# Patient Record
Sex: Female | Born: 1950 | Race: White | Hispanic: No | Marital: Married
Health system: Southern US, Community
[De-identification: ages and names within clinical notes are randomized; demographics above are authoritative.]

## PROBLEM LIST (undated history)

## (undated) DIAGNOSIS — F32A Depression, unspecified: Secondary | ICD-10-CM

## (undated) DIAGNOSIS — F329 Major depressive disorder, single episode, unspecified: Secondary | ICD-10-CM

## (undated) DIAGNOSIS — M810 Age-related osteoporosis without current pathological fracture: Secondary | ICD-10-CM

## (undated) DIAGNOSIS — H3320 Serous retinal detachment, unspecified eye: Secondary | ICD-10-CM

## (undated) DIAGNOSIS — M79609 Pain in unspecified limb: Secondary | ICD-10-CM

## (undated) HISTORY — PX: TONSILECTOMY, ADENOIDECTOMY, BILATERAL MYRINGOTOMY AND TUBES: SHX2538

## (undated) HISTORY — DX: Pain in unspecified limb: M79.609

## (undated) HISTORY — DX: Age-related osteoporosis without current pathological fracture: M81.0

## (undated) HISTORY — DX: Serous retinal detachment, unspecified eye: H33.20

## (undated) HISTORY — PX: OTHER SURGICAL HISTORY: SHX169

## (undated) SURGERY — Surgical Case
Anesthesia: *Unknown

---

## 2011-02-25 ENCOUNTER — Other Ambulatory Visit (HOSPITAL_COMMUNITY)
Admission: RE | Admit: 2011-02-25 | Discharge: 2011-02-25 | Disposition: A | Discharge status: Home / Self Care | Payer: 59 | Source: Ambulatory Visit | Attending: Family Medicine | Admitting: Family Medicine

## 2011-02-25 DIAGNOSIS — Z124 Encounter for screening for malignant neoplasm of cervix: Secondary | ICD-10-CM | POA: Insufficient documentation

## 2011-02-25 DIAGNOSIS — Z1159 Encounter for screening for other viral diseases: Secondary | ICD-10-CM | POA: Insufficient documentation

## 2012-06-08 DIAGNOSIS — H3320 Serous retinal detachment, unspecified eye: Secondary | ICD-10-CM

## 2012-06-08 HISTORY — DX: Serous retinal detachment, unspecified eye: H33.20

## 2013-09-29 ENCOUNTER — Encounter: Payer: Self-pay | Admitting: Neurology

## 2013-10-02 ENCOUNTER — Encounter: Payer: Self-pay | Admitting: Neurology

## 2013-10-02 ENCOUNTER — Ambulatory Visit (INDEPENDENT_AMBULATORY_CARE_PROVIDER_SITE_OTHER): Payer: BC Managed Care – PPO | Admitting: Neurology

## 2013-10-02 ENCOUNTER — Encounter (INDEPENDENT_AMBULATORY_CARE_PROVIDER_SITE_OTHER): Payer: Self-pay

## 2013-10-02 VITALS — BP 136/83 | HR 79 | Ht 64.25 in | Wt 155.0 lb

## 2013-10-02 DIAGNOSIS — M79609 Pain in unspecified limb: Secondary | ICD-10-CM

## 2013-10-02 HISTORY — DX: Pain in unspecified limb: M79.609

## 2013-10-02 NOTE — Progress Notes (Signed)
Reason for visit: Foot pain  Beth SprangSusan Jackson is a 63 y.o. female  History of present illness:  This is a 63 year old right-handed white female with a history of problems with discomfort in the feet dating back approximately one year. The patient has some burning sensations on the sole of the foot that is present when she is off of her feet, and this does not keep her from getting to sleep. She indicates that while up on her feet, there is some pain in the ball the feet , right greater than left. This pain is more of an achy pain, not a burning pain. This is better when she is off of her feet. The patient denies any significant low back pain, but she does have some slight discomfort in the groin area on the right. She denies problems controlling the bowels or the bladder. There is no problem with balance. The patient denies any issues with the hands. She is sent to this office for further evaluation. There is no family history of a peripheral neuropathy.  Past Medical History  Diagnosis Date  . Osteoporosis 05/1013  . Retinal detachment 06/2012  . Pain in limb 10/02/2013    Past Surgical History  Procedure Laterality Date  . None    . Tonsilectomy, adenoidectomy, bilateral myringotomy and tubes      Family History  Problem Relation Age of Onset  . Heart disease Mother   . Heart disease Father   . Heart attack Father   . Cancer - Other Maternal Grandmother     melanoma  . Heart disease Brother   . Cancer - Other Sister   . Neuropathy Neg Hx     Social history:  reports that she has quit smoking. She has never used smokeless tobacco. She reports that she drinks alcohol. She reports that she does not use illicit drugs.  Medications:  Current Outpatient Prescriptions on File Prior to Visit  Medication Sig Dispense Refill  . aspirin 81 MG tablet Take 81 mg by mouth daily.      . Multiple Vitamin (MULTIVITAMIN) tablet Take 1 tablet by mouth daily.      . Multiple Vitamins-Minerals  (VISION FORMULA PO) Take 1 capsule by mouth daily.       No current facility-administered medications on file prior to visit.      Allergies  Allergen Reactions  . Boniva [Ibandronic Acid]   . Codeine   . Zithromax [Azithromycin]     ROS:  Out of a complete 14 system review of symptoms, the patient complains only of the following symptoms, and all other reviewed systems are negative.  Weight gain Foot discomfort  Blood pressure 136/83, pulse 79, height 5' 4.25" (1.632 m), weight 155 lb (70.308 kg).  Physical Exam  General: The patient is alert and cooperative at the time of the examination.  Eyes: Pupils are equal, round, and reactive to light. Discs are flat bilaterally.  Neck: The neck is supple, no carotid bruits are noted.  Respiratory: The respiratory examination is clear.  Cardiovascular: The cardiovascular examination reveals a regular rate and rhythm, no obvious murmurs or rubs are noted.  Neuromuscular: The patient has good range of movement of the lumbosacral spine.  Skin: Extremities are without significant edema.  Neurologic Exam  Mental status: The patient is alert and oriented x 3 at the time of the examination. The patient has apparent normal recent and remote memory, with an apparently normal attention span and concentration ability.  Cranial  nerves: Facial symmetry is present. There is good sensation of the face to pinprick and soft touch bilaterally. The strength of the facial muscles and the muscles to head turning and shoulder shrug are normal bilaterally. Speech is well enunciated, no aphasia or dysarthria is noted. Extraocular movements are full. Visual fields are full. The tongue is midline, and the patient has symmetric elevation of the soft palate. No obvious hearing deficits are noted.  Motor: The motor testing reveals 5 over 5 strength of all 4 extremities. Good symmetric motor tone is noted throughout. The patient is able to walk on heels and  the toes.  Sensory: Sensory testing is intact to pinprick, soft touch, vibration sensation, and position sense on all 4 extremities. No evidence of extinction is noted.  Coordination: Cerebellar testing reveals good finger-nose-finger and heel-to-shin bilaterally.  Gait and station: Gait is normal. Tandem gait is normal. Romberg is negative. No drift is seen.  Reflexes: Deep tendon reflexes are symmetric and normal bilaterally. Toes are downgoing bilaterally.   Assessment/Plan:  1. Bilateral foot discomfort, possible peripheral neuropathy  The patient has some burning pain only the bottom of the feet, not on the top of the feet or toes. The patient could have a tarsal tunnel syndrome, but an early peripheral neuropathy does need to be excluded. The patient will undergo nerve conduction studies of both legs, EMG of the right leg. The patient will need to have the plantar sensory latencies as well with the studies. The patient does not wish medications for discomfort at this point. She will followup for the EMG evaluation. Blood work will be done today.  Marlan Palau. Keith Willis MD 10/02/2013 7:42 PM  [facility] [address] [place], [place] [address]  Phone [phone] Fax [phone]

## 2013-10-03 LAB — ANA W/REFLEX: ANA: NEGATIVE

## 2013-10-03 LAB — RHEUMATOID FACTOR: Rhuematoid fact SerPl-aCnc: 11.4 IU/mL (ref 0.0–13.9)

## 2013-10-03 LAB — VITAMIN B12: Vitamin B-12: 627 pg/mL (ref 211–946)

## 2013-10-03 LAB — LYME, TOTAL AB TEST/REFLEX: Lyme IgG/IgM Ab: <0.91 {ISR} (ref 0.00–0.90)

## 2013-10-03 LAB — ANGIOTENSIN CONVERTING ENZYME: Angio Convert Enzyme: 52 U/L (ref 14–82)

## 2013-10-04 NOTE — Progress Notes (Signed)
Quick Note:  I called and spoke to husband and let him know results of labs. Pt is to call back if questions. ______

## 2013-10-16 ENCOUNTER — Encounter (INDEPENDENT_AMBULATORY_CARE_PROVIDER_SITE_OTHER): Payer: Self-pay | Admitting: Radiology

## 2013-10-16 ENCOUNTER — Ambulatory Visit (INDEPENDENT_AMBULATORY_CARE_PROVIDER_SITE_OTHER): Payer: BC Managed Care – PPO | Admitting: Neurology

## 2013-10-16 DIAGNOSIS — M79609 Pain in unspecified limb: Secondary | ICD-10-CM

## 2013-10-16 DIAGNOSIS — Z0289 Encounter for other administrative examinations: Secondary | ICD-10-CM

## 2013-10-16 NOTE — Procedures (Signed)
     HISTORY:  Beth SprangSusan Jackson is a 63 year old patient with a one-year history of discomfort in the feet with weightbearing, right greater than left. She reports discomfort and burning sensations in the distal portion of the ball of the foot including the toes bilaterally. There is no sensation on the dorsum of the foot. She is being evaluated for a possible neuropathy or a lumbosacral radiculopathy.  NERVE CONDUCTION STUDIES:  Nerve conduction studies were performed on both lower extremities. The distal motor latencies and motor amplitudes for the peroneal and posterior tibial nerves were within normal limits. The nerve conduction velocities for these nerves were also normal. The F wave latencies were normal for the peroneal and posterior tibial nerves bilaterally. The sensory latencies for the peroneal nerves were within normal limits. The medial and lateral plantar sensory latencies were within normal limits bilaterally.   EMG STUDIES:  EMG study was performed on the right lower extremity:  The tibialis anterior muscle reveals 2 to 4K motor units with full recruitment. No fibrillations or positive waves were seen. The peroneus tertius muscle reveals 2 to 4K motor units with full recruitment. No fibrillations or positive waves were seen. The medial gastrocnemius muscle reveals 1 to 3K motor units with full recruitment. No fibrillations or positive waves were seen. The vastus lateralis muscle reveals 2 to 4K motor units with full recruitment. No fibrillations or positive waves were seen. The iliopsoas muscle reveals 2 to 4K motor units with full recruitment. No fibrillations or positive waves were seen. The biceps femoris muscle (long head) reveals 2 to 4K motor units with full recruitment. No fibrillations or positive waves were seen. The lumbosacral paraspinal muscles were tested at 3 levels, and revealed no abnormalities of insertional activity at all 3 levels tested. There was good  relaxation.   IMPRESSION:  Nerve conduction studies done on both lower extremities were unremarkable. No evidence of a peripheral neuropathy is seen. A small fiber neuropathy may be missed by a standard nerve conduction studies, however. Clinical correlation is required. EMG evaluation of the right lower extremity was unremarkable, without evidence of an overlying lumbosacral radiculopathy.  Marlan Palau. Keith Heaven Wandell MD 10/16/2013 1:48 PM  [facility] [address] [place], [place] [address]  Phone [phone] Fax [phone]

## 2013-10-16 NOTE — Progress Notes (Signed)
Beth SprangSusan Spadoni is a 63 year old patient with a one-year history of bilateral foot pain mainly in the ball of foot associated with weightbearing. She may have some burning sensations at times. She is being evaluated for possible neuropathy or a lumbosacral radiculopathy.  Nerve conduction studies done on both lower extremity were unremarkable, no evidence of a peripheral neuropathy is seen. EMG evaluation of the right lower extremity was normal without evidence of a right lumbosacral radiculopathy.  The patient is having ongoing discomfort. We will consider a referral to a podiatrist for further evaluation.  The patient will followup through this office if needed.

## 2013-11-07 ENCOUNTER — Ambulatory Visit: Payer: BC Managed Care – PPO

## 2014-04-25 ENCOUNTER — Encounter: Payer: Self-pay | Admitting: Neurology

## 2014-05-01 ENCOUNTER — Encounter: Payer: Self-pay | Admitting: Neurology

## 2016-06-07 ENCOUNTER — Emergency Department (HOSPITAL_COMMUNITY): Payer: PPO

## 2016-06-07 ENCOUNTER — Encounter (HOSPITAL_COMMUNITY): Admission: EM | Disposition: E | Payer: Self-pay | Source: Home / Self Care | Attending: Neurosurgery

## 2016-06-07 ENCOUNTER — Encounter (HOSPITAL_COMMUNITY): Payer: Self-pay

## 2016-06-07 ENCOUNTER — Inpatient Hospital Stay (HOSPITAL_COMMUNITY)
Admission: EM | Admit: 2016-06-07 | Discharge: 2016-07-09 | DRG: 025 | Disposition: E | Payer: PPO | Attending: Neurosurgery | Admitting: Neurosurgery

## 2016-06-07 ENCOUNTER — Inpatient Hospital Stay (HOSPITAL_COMMUNITY): Payer: PPO

## 2016-06-07 ENCOUNTER — Emergency Department (HOSPITAL_COMMUNITY): Payer: PPO | Admitting: Anesthesiology

## 2016-06-07 ENCOUNTER — Encounter (HOSPITAL_COMMUNITY): Payer: Self-pay | Admitting: Certified Registered Nurse Anesthetist

## 2016-06-07 ENCOUNTER — Other Ambulatory Visit: Payer: Self-pay

## 2016-06-07 ENCOUNTER — Encounter (HOSPITAL_COMMUNITY)
Admission: EM | Discharge status: Absent without leave | Payer: Self-pay | Source: Home / Self Care | Attending: Emergency Medicine

## 2016-06-07 ENCOUNTER — Emergency Department (HOSPITAL_COMMUNITY)
Admission: EM | Admit: 2016-06-07 | Discharge: 2016-06-07 | Discharge status: Absent without leave | Payer: PPO | Attending: Internal Medicine | Admitting: Internal Medicine

## 2016-06-07 DIAGNOSIS — Z8249 Family history of ischemic heart disease and other diseases of the circulatory system: Secondary | ICD-10-CM | POA: Diagnosis not present

## 2016-06-07 DIAGNOSIS — G936 Cerebral edema: Secondary | ICD-10-CM | POA: Diagnosis not present

## 2016-06-07 DIAGNOSIS — Z9889 Other specified postprocedural states: Secondary | ICD-10-CM

## 2016-06-07 DIAGNOSIS — I959 Hypotension, unspecified: Secondary | ICD-10-CM

## 2016-06-07 DIAGNOSIS — S0003XA Contusion of scalp, initial encounter: Secondary | ICD-10-CM

## 2016-06-07 DIAGNOSIS — G9340 Encephalopathy, unspecified: Secondary | ICD-10-CM | POA: Diagnosis present

## 2016-06-07 DIAGNOSIS — G932 Benign intracranial hypertension: Secondary | ICD-10-CM | POA: Diagnosis present

## 2016-06-07 DIAGNOSIS — W182XXA Fall in (into) shower or empty bathtub, initial encounter: Secondary | ICD-10-CM | POA: Diagnosis present

## 2016-06-07 DIAGNOSIS — R9431 Abnormal electrocardiogram [ECG] [EKG]: Secondary | ICD-10-CM

## 2016-06-07 DIAGNOSIS — Y92012 Bathroom of single-family (private) house as the place of occurrence of the external cause: Secondary | ICD-10-CM

## 2016-06-07 DIAGNOSIS — I4891 Unspecified atrial fibrillation: Secondary | ICD-10-CM | POA: Diagnosis not present

## 2016-06-07 DIAGNOSIS — E232 Diabetes insipidus: Secondary | ICD-10-CM | POA: Diagnosis not present

## 2016-06-07 DIAGNOSIS — S065XAA Traumatic subdural hemorrhage with loss of consciousness status unknown, initial encounter: Secondary | ICD-10-CM | POA: Diagnosis present

## 2016-06-07 DIAGNOSIS — G935 Compression of brain: Secondary | ICD-10-CM | POA: Diagnosis not present

## 2016-06-07 DIAGNOSIS — S065X9A Traumatic subdural hemorrhage with loss of consciousness of unspecified duration, initial encounter: Secondary | ICD-10-CM | POA: Diagnosis present

## 2016-06-07 DIAGNOSIS — Z87891 Personal history of nicotine dependence: Secondary | ICD-10-CM

## 2016-06-07 DIAGNOSIS — R40243 Glasgow coma scale score 3-8, unspecified time: Secondary | ICD-10-CM | POA: Diagnosis present

## 2016-06-07 DIAGNOSIS — Z789 Other specified health status: Secondary | ICD-10-CM

## 2016-06-07 DIAGNOSIS — R570 Cardiogenic shock: Secondary | ICD-10-CM | POA: Diagnosis present

## 2016-06-07 DIAGNOSIS — F329 Major depressive disorder, single episode, unspecified: Secondary | ICD-10-CM | POA: Diagnosis present

## 2016-06-07 DIAGNOSIS — I6200 Nontraumatic subdural hemorrhage, unspecified: Secondary | ICD-10-CM

## 2016-06-07 DIAGNOSIS — R55 Syncope and collapse: Secondary | ICD-10-CM | POA: Diagnosis present

## 2016-06-07 DIAGNOSIS — I213 ST elevation (STEMI) myocardial infarction of unspecified site: Secondary | ICD-10-CM

## 2016-06-07 DIAGNOSIS — R4182 Altered mental status, unspecified: Secondary | ICD-10-CM

## 2016-06-07 HISTORY — DX: Major depressive disorder, single episode, unspecified: F32.9

## 2016-06-07 HISTORY — PX: CRANIOTOMY: SHX93

## 2016-06-07 HISTORY — DX: Depression, unspecified: F32.A

## 2016-06-07 LAB — APTT: aPTT: 41 seconds — ABNORMAL HIGH (ref 24–36)

## 2016-06-07 LAB — I-STAT ARTERIAL BLOOD GAS, ED
Acid-base deficit: 6 mmol/L — ABNORMAL HIGH (ref 0.0–2.0)
BICARBONATE: 20.8 mmol/L (ref 20.0–28.0)
O2 Saturation: 100 %
PH ART: 7.267 — AB (ref 7.350–7.450)
PO2 ART: 257 mmHg — AB (ref 83.0–108.0)
TCO2: 22 mmol/L (ref 0–100)
pCO2 arterial: 45.2 mmHg (ref 32.0–48.0)

## 2016-06-07 LAB — TYPE AND SCREEN
ABO/RH(D): O POS
Antibody Screen: NEGATIVE

## 2016-06-07 LAB — I-STAT CHEM 8, ED
BUN: 18 mg/dL (ref 6–20)
CALCIUM ION: 1.09 mmol/L — AB (ref 1.15–1.40)
CHLORIDE: 104 mmol/L (ref 101–111)
Creatinine, Ser: 1 mg/dL (ref 0.44–1.00)
GLUCOSE: 263 mg/dL — AB (ref 65–99)
HCT: 40 % (ref 36.0–46.0)
Hemoglobin: 13.6 g/dL (ref 12.0–15.0)
Potassium: 3.5 mmol/L (ref 3.5–5.1)
Sodium: 140 mmol/L (ref 135–145)
TCO2: 18 mmol/L (ref 0–100)

## 2016-06-07 LAB — CBC
HCT: 42.1 % (ref 36.0–46.0)
Hemoglobin: 13.3 g/dL (ref 12.0–15.0)
MCH: 29.9 pg (ref 26.0–34.0)
MCHC: 31.6 g/dL (ref 30.0–36.0)
MCV: 94.6 fL (ref 78.0–100.0)
PLATELETS: 208 10*3/uL (ref 150–400)
RBC: 4.45 MIL/uL (ref 3.87–5.11)
RDW: 13.9 % (ref 11.5–15.5)
WBC: 9.4 10*3/uL (ref 4.0–10.5)

## 2016-06-07 LAB — COMPREHENSIVE METABOLIC PANEL
ALK PHOS: 48 U/L (ref 38–126)
ALT: 21 U/L (ref 14–54)
ANION GAP: 13 (ref 5–15)
AST: 39 U/L (ref 15–41)
Albumin: 3.2 g/dL — ABNORMAL LOW (ref 3.5–5.0)
BILIRUBIN TOTAL: 1 mg/dL (ref 0.3–1.2)
BUN: 16 mg/dL (ref 6–20)
CALCIUM: 8.6 mg/dL — AB (ref 8.9–10.3)
CO2: 19 mmol/L — ABNORMAL LOW (ref 22–32)
Chloride: 105 mmol/L (ref 101–111)
Creatinine, Ser: 1.26 mg/dL — ABNORMAL HIGH (ref 0.44–1.00)
GFR, EST AFRICAN AMERICAN: 51 mL/min — AB (ref >60)
GFR, EST NON AFRICAN AMERICAN: 44 mL/min — AB (ref >60)
Glucose, Bld: 263 mg/dL — ABNORMAL HIGH (ref 65–99)
Potassium: 3.5 mmol/L (ref 3.5–5.1)
SODIUM: 137 mmol/L (ref 135–145)
TOTAL PROTEIN: 5.8 g/dL — AB (ref 6.5–8.1)

## 2016-06-07 LAB — POCT I-STAT 7, (LYTES, BLD GAS, ICA,H+H)
ACID-BASE DEFICIT: 4 mmol/L — AB (ref 0.0–2.0)
ACID-BASE DEFICIT: 6 mmol/L — AB (ref 0.0–2.0)
Bicarbonate: 21.6 mmol/L (ref 20.0–28.0)
Bicarbonate: 19.9 mmol/L — ABNORMAL LOW (ref 20.0–28.0)
CALCIUM ION: 0.92 mmol/L — AB (ref 1.15–1.40)
Calcium, Ion: 1.05 mmol/L — ABNORMAL LOW (ref 1.15–1.40)
HEMATOCRIT: 38 % (ref 36.0–46.0)
HEMATOCRIT: 19 % — AB (ref 36.0–46.0)
HEMOGLOBIN: 12.9 g/dL (ref 12.0–15.0)
HEMOGLOBIN: 6.5 g/dL — AB (ref 12.0–15.0)
O2 SAT: 100 %
O2 SAT: 100 %
PCO2 ART: 35.7 mmHg (ref 32.0–48.0)
PH ART: 7.322 — AB (ref 7.350–7.450)
PO2 ART: 442 mmHg — AB (ref 83.0–108.0)
POTASSIUM: 3.7 mmol/L (ref 3.5–5.1)
POTASSIUM: 3.8 mmol/L (ref 3.5–5.1)
Patient temperature: 33.1
SODIUM: 150 mmol/L — AB (ref 135–145)
Sodium: 143 mmol/L (ref 135–145)
TCO2: 23 mmol/L (ref 0–100)
TCO2: 21 mmol/L (ref 0–100)
pCO2 arterial: 41.7 mmHg (ref 32.0–48.0)
pH, Arterial: 7.335 — ABNORMAL LOW (ref 7.350–7.450)
pO2, Arterial: 374 mmHg — ABNORMAL HIGH (ref 83.0–108.0)

## 2016-06-07 LAB — DIFFERENTIAL
BASOS PCT: 1 %
Basophils Absolute: 0.1 10*3/uL (ref 0.0–0.1)
EOS ABS: 0.1 10*3/uL (ref 0.0–0.7)
EOS PCT: 2 %
Lymphocytes Relative: 45 %
Lymphs Abs: 4.3 10*3/uL — ABNORMAL HIGH (ref 0.7–4.0)
MONO ABS: 0.5 10*3/uL (ref 0.1–1.0)
Monocytes Relative: 6 %
Neutro Abs: 4.4 10*3/uL (ref 1.7–7.7)
Neutrophils Relative %: 46 %

## 2016-06-07 LAB — SODIUM
SODIUM: 147 mmol/L — AB (ref 135–145)
Sodium: 139 mmol/L (ref 135–145)

## 2016-06-07 LAB — ABO/RH: ABO/RH(D): O POS

## 2016-06-07 LAB — POCT I-STAT 4, (NA,K, GLUC, HGB,HCT)
Glucose, Bld: 206 mg/dL — ABNORMAL HIGH (ref 65–99)
HEMATOCRIT: 39 % (ref 36.0–46.0)
HEMOGLOBIN: 13.3 g/dL (ref 12.0–15.0)
Potassium: 3.8 mmol/L (ref 3.5–5.1)
SODIUM: 143 mmol/L (ref 135–145)

## 2016-06-07 LAB — LIPID PANEL
CHOLESTEROL: 204 mg/dL — AB (ref 0–200)
HDL: 50 mg/dL (ref >40)
LDL Cholesterol: 129 mg/dL — ABNORMAL HIGH (ref 0–99)
TRIGLYCERIDES: 124 mg/dL (ref <150)
Total CHOL/HDL Ratio: 4.1 RATIO
VLDL: 25 mg/dL (ref 0–40)

## 2016-06-07 LAB — I-STAT TROPONIN, ED: Troponin i, poc: 0.03 ng/mL (ref 0.00–0.08)

## 2016-06-07 LAB — PROTIME-INR
INR: 1.26
PROTHROMBIN TIME: 15.9 s — AB (ref 11.4–15.2)

## 2016-06-07 LAB — TROPONIN I: Troponin I: 0.03 ng/mL (ref <0.03)

## 2016-06-07 LAB — OSMOLALITY, URINE: Osmolality, Ur: 113 mosm/kg — ABNORMAL LOW (ref 300–900)

## 2016-06-07 LAB — GLUCOSE, CAPILLARY: Glucose-Capillary: 212 mg/dL — ABNORMAL HIGH (ref 65–99)

## 2016-06-07 SURGERY — INVASIVE LAB ABORTED CASE

## 2016-06-07 SURGERY — CRANIOTOMY HEMATOMA EVACUATION SUBDURAL
Anesthesia: General | Site: Head | Laterality: Right

## 2016-06-07 SURGERY — LEFT HEART CATH AND CORONARY ANGIOGRAPHY
Anesthesia: LOCAL

## 2016-06-07 MED ORDER — EPINEPHRINE PF 1 MG/ML IJ SOLN
0.5000 ug/min | INTRAVENOUS | Status: DC
  Filled 2016-06-07: qty 4

## 2016-06-07 MED ORDER — SODIUM BICARBONATE 4.2 % IV SOLN
INTRAVENOUS | Status: DC | PRN
  Administered 2016-06-07: 50 meq via INTRAVENOUS

## 2016-06-07 MED ORDER — SODIUM CHLORIDE 0.9 % IV SOLN
INTRAVENOUS | Status: DC
  Administered 2016-06-07: 16:00:00 via INTRAVENOUS

## 2016-06-07 MED ORDER — SODIUM CHLORIDE 0.9 % IV SOLN
INTRAVENOUS | Status: DC | PRN
  Administered 2016-06-07 (×2): via INTRAVENOUS

## 2016-06-07 MED ORDER — SODIUM CHLORIDE 0.9 % IV BOLUS (SEPSIS)
1000.0000 mL | Freq: Once | INTRAVENOUS | Status: AC
  Administered 2016-06-07: 1000 mL via INTRAVENOUS

## 2016-06-07 MED ORDER — PANTOPRAZOLE SODIUM 40 MG IV SOLR
40.0000 mg | Freq: Every day | INTRAVENOUS | Status: DC
  Administered 2016-06-07: 40 mg via INTRAVENOUS
  Filled 2016-06-07: qty 40

## 2016-06-07 MED ORDER — PHENYLEPHRINE HCL 10 MG/ML IJ SOLN
INTRAMUSCULAR | Status: DC | PRN
  Administered 2016-06-07: 160 ug via INTRAVENOUS

## 2016-06-07 MED ORDER — HYDROMORPHONE HCL 1 MG/ML IJ SOLN: 0.5000 mg | INTRAMUSCULAR | Status: DC | PRN

## 2016-06-07 MED ORDER — BACITRACIN 50000 UNITS IM SOLR
INTRAMUSCULAR | Status: DC | PRN
  Administered 2016-06-07: 500 mL

## 2016-06-07 MED ORDER — CEFAZOLIN IN D5W 1 GM/50ML IV SOLN
1.0000 g | Freq: Three times a day (TID) | INTRAVENOUS | Status: AC
  Administered 2016-06-07 – 2016-06-08 (×2): 1 g via INTRAVENOUS
  Filled 2016-06-07 (×2): qty 50

## 2016-06-07 MED ORDER — BACITRACIN ZINC 500 UNIT/GM EX OINT
TOPICAL_OINTMENT | CUTANEOUS | Status: AC
  Filled 2016-06-07: qty 28.35

## 2016-06-07 MED ORDER — LACTATED RINGERS IV BOLUS (SEPSIS)
1000.0000 mL | Freq: Once | INTRAVENOUS | Status: AC
  Administered 2016-06-07: 1000 mL via INTRAVENOUS

## 2016-06-07 MED ORDER — HEPARIN (PORCINE) IN NACL 2-0.9 UNIT/ML-% IJ SOLN
INTRAMUSCULAR | Status: AC
  Filled 2016-06-07: qty 1000

## 2016-06-07 MED ORDER — THROMBIN 5000 UNITS EX SOLR
CUTANEOUS | Status: AC
  Filled 2016-06-07: qty 5000

## 2016-06-07 MED ORDER — EPINEPHRINE PF 1 MG/10ML IJ SOSY
PREFILLED_SYRINGE | INTRAMUSCULAR | Status: DC | PRN
  Administered 2016-06-07 (×2): 25 ug via INTRAVENOUS

## 2016-06-07 MED ORDER — THROMBIN 5000 UNITS EX SOLR
CUTANEOUS | Status: AC
  Filled 2016-06-07: qty 10000

## 2016-06-07 MED ORDER — ARTIFICIAL TEARS OP OINT
TOPICAL_OINTMENT | OPHTHALMIC | Status: AC
  Filled 2016-06-07: qty 3.5

## 2016-06-07 MED ORDER — ONDANSETRON HCL 4 MG PO TABS: 4.0000 mg | ORAL_TABLET | ORAL | Status: DC | PRN

## 2016-06-07 MED ORDER — LACTATED RINGERS IV SOLN
INTRAVENOUS | Status: DC
  Administered 2016-06-07 – 2016-06-08 (×2): via INTRAVENOUS

## 2016-06-07 MED ORDER — IOPAMIDOL (ISOVUE-370) INJECTION 76%
INTRAVENOUS | Status: AC
  Filled 2016-06-07: qty 100

## 2016-06-07 MED ORDER — ORAL CARE MOUTH RINSE
15.0000 mL | OROMUCOSAL | Status: DC
  Administered 2016-06-08 (×3): 15 mL via OROMUCOSAL

## 2016-06-07 MED ORDER — SODIUM CHLORIDE 0.9 % IV SOLN
500.0000 mg | Freq: Two times a day (BID) | INTRAVENOUS | Status: DC
  Administered 2016-06-07 – 2016-06-08 (×2): 500 mg via INTRAVENOUS
  Filled 2016-06-07 (×3): qty 5

## 2016-06-07 MED ORDER — 0.9 % SODIUM CHLORIDE (POUR BTL) OPTIME
TOPICAL | Status: DC | PRN
  Administered 2016-06-07 (×2): 1000 mL

## 2016-06-07 MED ORDER — CHLORHEXIDINE GLUCONATE 0.12% ORAL RINSE (MEDLINE KIT)
15.0000 mL | Freq: Two times a day (BID) | OROMUCOSAL | Status: DC
  Administered 2016-06-08: 15 mL via OROMUCOSAL

## 2016-06-07 MED ORDER — PROMETHAZINE HCL 25 MG PO TABS
12.5000 mg | ORAL_TABLET | ORAL | Status: DC | PRN
Start: 2016-06-07 — End: 2016-06-09

## 2016-06-07 MED ORDER — THROMBIN 5000 UNITS EX SOLR
CUTANEOUS | Status: DC | PRN
  Administered 2016-06-07: 5 mL

## 2016-06-07 MED ORDER — ROCURONIUM BROMIDE 50 MG/5ML IV SOSY
PREFILLED_SYRINGE | INTRAVENOUS | Status: AC
  Filled 2016-06-07: qty 5

## 2016-06-07 MED ORDER — LIDOCAINE HCL (PF) 1 % IJ SOLN
INTRAMUSCULAR | Status: AC
  Filled 2016-06-07: qty 30

## 2016-06-07 MED ORDER — ONDANSETRON HCL 4 MG/2ML IJ SOLN: 4.0000 mg | INTRAMUSCULAR | Status: DC | PRN

## 2016-06-07 MED ORDER — THROMBIN 20000 UNITS EX SOLR
CUTANEOUS | Status: AC
  Filled 2016-06-07: qty 20000

## 2016-06-07 MED ORDER — THROMBIN 20000 UNITS EX SOLR
CUTANEOUS | Status: DC | PRN
  Administered 2016-06-07: 20 mL

## 2016-06-07 MED ORDER — NOREPINEPHRINE BITARTRATE 1 MG/ML IV SOLN
0.0000 ug/min | INTRAVENOUS | Status: DC
  Administered 2016-06-07: 16 ug/min via INTRAVENOUS
  Administered 2016-06-07: 30 ug/min via INTRAVENOUS
  Administered 2016-06-07: 5 ug/min via INTRAVENOUS
  Filled 2016-06-07 (×3): qty 4

## 2016-06-07 MED ORDER — DOPAMINE-DEXTROSE 1.6-5 MG/ML-% IV SOLN
2.0000 ug/kg/min | Freq: Once | INTRAVENOUS | Status: DC
  Filled 2016-06-07: qty 250

## 2016-06-07 MED ORDER — NITROGLYCERIN 1 MG/10 ML FOR IR/CATH LAB
INTRA_ARTERIAL | Status: AC
  Filled 2016-06-07: qty 10

## 2016-06-07 MED ORDER — SODIUM CHLORIDE 23.4 % INJECTION (4 MEQ/ML) FOR IV ADMINISTRATION
30.0000 mL | Freq: Once | INTRAVENOUS | Status: AC
  Administered 2016-06-07: 120 meq via INTRAVENOUS
  Filled 2016-06-07: qty 30

## 2016-06-07 MED ORDER — CEFAZOLIN SODIUM-DEXTROSE 2-4 GM/100ML-% IV SOLN
INTRAVENOUS | Status: AC
  Filled 2016-06-07: qty 100

## 2016-06-07 MED ORDER — ASPIRIN 300 MG RE SUPP
300.0000 mg | Freq: Once | RECTAL | Status: AC
  Administered 2016-06-07: 300 mg via RECTAL
  Filled 2016-06-07: qty 1

## 2016-06-07 MED ORDER — VASOPRESSIN 20 UNIT/ML IV SOLN
0.0400 [IU]/min | INTRAVENOUS | Status: DC
  Administered 2016-06-08: 0.04 [IU]/min via INTRAVENOUS
  Filled 2016-06-07: qty 2

## 2016-06-07 MED ORDER — CEFAZOLIN SODIUM-DEXTROSE 2-3 GM-% IV SOLR
INTRAVENOUS | Status: DC | PRN
  Administered 2016-06-07: 2 g via INTRAVENOUS

## 2016-06-07 MED ORDER — BUPIVACAINE HCL (PF) 0.5 % IJ SOLN
INTRAMUSCULAR | Status: AC
  Filled 2016-06-07: qty 30

## 2016-06-07 MED ORDER — SODIUM CHLORIDE 0.9 % IV SOLN
INTRAVENOUS | Status: DC | PRN
  Administered 2016-06-07: 13:00:00 via INTRAVENOUS

## 2016-06-07 MED ORDER — ALBUMIN HUMAN 5 % IV SOLN
INTRAVENOUS | Status: DC | PRN
Start: 2016-06-07 — End: 2016-06-07
  Administered 2016-06-07 (×3): via INTRAVENOUS

## 2016-06-07 MED ORDER — ROCURONIUM BROMIDE 10 MG/ML (PF) SYRINGE
PREFILLED_SYRINGE | INTRAVENOUS | Status: DC | PRN
  Administered 2016-06-07: 50 mg via INTRAVENOUS

## 2016-06-07 MED ORDER — SODIUM CHLORIDE 0.9 % IV SOLN
INTRAVENOUS | Status: DC
  Administered 2016-06-07: 2.9 [IU]/h via INTRAVENOUS
  Filled 2016-06-07 (×2): qty 2.5

## 2016-06-07 MED ORDER — EPINEPHRINE PF 1 MG/ML IJ SOLN
INTRAMUSCULAR | Status: DC | PRN
  Administered 2016-06-07: 4 ug/min via INTRAVENOUS

## 2016-06-07 MED ORDER — LIDOCAINE-EPINEPHRINE (PF) 2 %-1:200000 IJ SOLN
INTRAMUSCULAR | Status: AC
  Filled 2016-06-07: qty 20

## 2016-06-07 SURGICAL SUPPLY — 14 items
CATH INFINITI 5FR ANG PIGTAIL (CATHETERS) IMPLANT
CATH INFINITI JR4 5F (CATHETERS) IMPLANT
DEVICE SECURE STATLOCK IABP (MISCELLANEOUS) IMPLANT
KIT ENCORE 26 ADVANTAGE (KITS) IMPLANT
KIT HEART LEFT (KITS) IMPLANT
PACK CARDIAC CATHETERIZATION (CUSTOM PROCEDURE TRAY) IMPLANT
SET INTRODUCER MICROPUNCT 5F (INTRODUCER) IMPLANT
SHEATH PINNACLE 6F 10CM (SHEATH) IMPLANT
SHEATH PINNACLE 8F 10CM (SHEATH) IMPLANT
SLEEVE REPOSITIONING LENGTH 30 (MISCELLANEOUS) IMPLANT
TRANSDUCER W/STOPCOCK (MISCELLANEOUS) IMPLANT
TUBING CIL FLEX 10 FLL-RA (TUBING) IMPLANT
WIRE EMERALD 3MM-J .035X150CM (WIRE) IMPLANT
WIRE RUNTHROUGH .014X180CM (WIRE) IMPLANT

## 2016-06-07 SURGICAL SUPPLY — 72 items
BANDAGE GAUZE 4  KLING STR (GAUZE/BANDAGES/DRESSINGS) ×3 IMPLANT
BENZOIN TINCTURE PRP APPL 2/3 (GAUZE/BANDAGES/DRESSINGS) IMPLANT
BLADE CLIPPER SURG (BLADE) ×3 IMPLANT
BLADE ULTRA TIP 2M (BLADE) ×3 IMPLANT
BNDG GAUZE ELAST 4 BULKY (GAUZE/BANDAGES/DRESSINGS) ×3 IMPLANT
BUR ACORN 6.0 PRECISION (BURR) ×2 IMPLANT
BUR ACORN 6.0MM PRECISION (BURR) ×1
BUR MATCHSTICK NEURO 3.0 LAGG (BURR) IMPLANT
BUR SPIRAL ROUTER 2.3 (BUR) ×2 IMPLANT
BUR SPIRAL ROUTER 2.3MM (BUR) ×1
CANISTER SUCT 3000ML PPV (MISCELLANEOUS) ×3 IMPLANT
CARTRIDGE OIL MAESTRO DRILL (MISCELLANEOUS) ×1 IMPLANT
CLIP TI MEDIUM 6 (CLIP) IMPLANT
DIFFUSER DRILL AIR PNEUMATIC (MISCELLANEOUS) ×3 IMPLANT
DRAPE NEUROLOGICAL W/INCISE (DRAPES) ×3 IMPLANT
DRAPE SURG 17X23 STRL (DRAPES) IMPLANT
DRAPE WARM FLUID 44X44 (DRAPE) ×3 IMPLANT
DURAPREP 6ML APPLICATOR 50/CS (WOUND CARE) ×3 IMPLANT
ELECT CAUTERY BLADE 6.4 (BLADE) ×3 IMPLANT
ELECT REM PT RETURN 9FT ADLT (ELECTROSURGICAL) ×3
ELECTRODE REM PT RTRN 9FT ADLT (ELECTROSURGICAL) ×1 IMPLANT
EVACUATOR 1/8 PVC DRAIN (DRAIN) IMPLANT
EVACUATOR SILICONE 100CC (DRAIN) IMPLANT
GAUZE SPONGE 4X4 12PLY STRL (GAUZE/BANDAGES/DRESSINGS) ×3 IMPLANT
GAUZE SPONGE 4X4 16PLY XRAY LF (GAUZE/BANDAGES/DRESSINGS) IMPLANT
GLOVE BIO SURGEON STRL SZ7 (GLOVE) ×6 IMPLANT
GLOVE BIOGEL PI IND STRL 7.5 (GLOVE) ×2 IMPLANT
GLOVE BIOGEL PI INDICATOR 7.5 (GLOVE) ×4
GLOVE ECLIPSE 7.0 STRL STRAW (GLOVE) ×6 IMPLANT
GLOVE EXAM NITRILE LRG STRL (GLOVE) IMPLANT
GLOVE EXAM NITRILE XL STR (GLOVE) IMPLANT
GLOVE EXAM NITRILE XS STR PU (GLOVE) IMPLANT
GOWN STRL REUS W/ TWL LRG LVL3 (GOWN DISPOSABLE) ×2 IMPLANT
GOWN STRL REUS W/ TWL XL LVL3 (GOWN DISPOSABLE) IMPLANT
GOWN STRL REUS W/TWL 2XL LVL3 (GOWN DISPOSABLE) IMPLANT
GOWN STRL REUS W/TWL LRG LVL3 (GOWN DISPOSABLE) ×4
GOWN STRL REUS W/TWL XL LVL3 (GOWN DISPOSABLE)
HEMOSTAT POWDER KIT SURGIFOAM (HEMOSTASIS) ×3 IMPLANT
HEMOSTAT SURGICEL 2X14 (HEMOSTASIS) IMPLANT
KIT BASIN OR (CUSTOM PROCEDURE TRAY) ×3 IMPLANT
KIT CLIP RANEY GUN (KITS) ×3 IMPLANT
KIT ROOM TURNOVER OR (KITS) ×3 IMPLANT
NEEDLE HYPO 25X1 1.5 SAFETY (NEEDLE) ×3 IMPLANT
NS IRRIG 1000ML POUR BTL (IV SOLUTION) ×3 IMPLANT
OIL CARTRIDGE MAESTRO DRILL (MISCELLANEOUS) ×3
PACK CRANIOTOMY (CUSTOM PROCEDURE TRAY) ×3 IMPLANT
PATTIES SURGICAL .5 X.5 (GAUZE/BANDAGES/DRESSINGS) IMPLANT
PATTIES SURGICAL .5 X3 (DISPOSABLE) IMPLANT
PATTIES SURGICAL 1X1 (DISPOSABLE) IMPLANT
SPONGE NEURO XRAY DETECT 1X3 (DISPOSABLE) IMPLANT
SPONGE SURGIFOAM ABS GEL 100 (HEMOSTASIS) ×3 IMPLANT
STAPLER VISISTAT 35W (STAPLE) ×3 IMPLANT
STOCKINETTE 6  STRL (DRAPES) ×2
STOCKINETTE 6 STRL (DRAPES) ×1 IMPLANT
SUT ETHILON 3 0 FSL (SUTURE) IMPLANT
SUT ETHILON 3 0 PS 1 (SUTURE) IMPLANT
SUT NURALON 4 0 TR CR/8 (SUTURE) ×9 IMPLANT
SUT PROLENE 0 CT 1 30 (SUTURE) ×6 IMPLANT
SUT STEEL 0 (SUTURE)
SUT STEEL 0 18XMFL TIE 17 (SUTURE) IMPLANT
SUT VIC AB 0 CT1 18XCR BRD8 (SUTURE) ×2 IMPLANT
SUT VIC AB 0 CT1 8-18 (SUTURE) ×4
SUT VIC AB 3-0 SH 8-18 (SUTURE) ×6 IMPLANT
SYR CONTROL 10ML LL (SYRINGE) ×6 IMPLANT
TAPE CLOTH 1X10 TAN NS (GAUZE/BANDAGES/DRESSINGS) ×3 IMPLANT
TOWEL OR 17X24 6PK STRL BLUE (TOWEL DISPOSABLE) ×3 IMPLANT
TOWEL OR 17X26 10 PK STRL BLUE (TOWEL DISPOSABLE) ×3 IMPLANT
TRAY FOLEY W/METER SILVER 16FR (SET/KITS/TRAYS/PACK) ×3 IMPLANT
TUBE CONNECTING 12'X1/4 (SUCTIONS) ×1
TUBE CONNECTING 12X1/4 (SUCTIONS) ×2 IMPLANT
UNDERPAD 30X30 (UNDERPADS AND DIAPERS) ×3 IMPLANT
WATER STERILE IRR 1000ML POUR (IV SOLUTION) ×3 IMPLANT

## 2016-06-07 NOTE — Progress Notes (Signed)
RT transported patient to OR. Patient was taking off of the vent and CRNA took over manual bag ventilation. RT took patients vent to 25M.

## 2016-06-07 NOTE — Transfer of Care (Signed)
Immediate Anesthesia Transfer of Care Note  Patient: Cameron SprangSusan Tally  Procedure(s) Performed: Procedure(s): CRANIECTOMY HEMATOMA EVACUATION SUBDURAL (Right)  Patient Location: ICU  Anesthesia Type:General  Level of Consciousness: unresponsive and Patient remains intubated per anesthesia plan  Airway & Oxygen Therapy: Patient remains intubated per anesthesia plan and Patient placed on Ventilator (see vital sign flow sheet for setting)  Post-op Assessment: Report given to RN and Post -op Vital signs reviewed and stable  Post vital signs: Reviewed and stable  Last Vitals:  Vitals:   03-24-2016 1250 03-24-2016 1255  BP: (!) 87/58 97/68  Pulse: 77 78  Resp: 16 16  Temp:      Last Pain:  Vitals:   03-24-2016 1102  TempSrc: Rectal         Complications: No apparent anesthesia complications

## 2016-06-07 NOTE — Anesthesia Postprocedure Evaluation (Signed)
Anesthesia Post Note  Patient: Beth Jackson  Procedure(s) Performed: Procedure(s) (LRB): CRANIECTOMY HEMATOMA EVACUATION SUBDURAL (Right)  Patient location during evaluation: ICU Anesthesia Type: General Level of consciousness: patient remains intubated per anesthesia plan Pain management: pain level controlled Vital Signs Assessment: post-procedure vital signs reviewed and stable Respiratory status: patient remains intubated per anesthesia plan Cardiovascular status: stable Anesthetic complications: no       Last Vitals:  Vitals:   18-Feb-2016 1750 18-Feb-2016 1755  BP: 110/70 102/63  Pulse: (!) 107 (!) 107  Resp: 16 16  Temp:      Last Pain:  Vitals:   18-Feb-2016 1525  TempSrc: Rectal                 Brett Darko

## 2016-06-07 NOTE — H&P (Signed)
CC:  Chief Complaint  Patient presents with  . Code STEMI  Fall  HPI: Beth Jackson is a 65 y.o. female brought in by EMS after being found at home in the shower by her husband after a fall. He says he heard a "thud" in the shower around 10a this morning (~2 hrs ago). She was curled up in the fetal position, breathing slowly. He called 911 and was intubated at the scene. She required a "shock" to regain normal heart rate. Upon arrival she was completely unresponsive.   Per family, she has no significant medical problems, takes a baby ASA daily and an anxiety pill.  PMH: Past Medical History:  Diagnosis Date  . Depression     PSH: History reviewed. No pertinent surgical history.  SH: Social History  Substance Use Topics  . Smoking status: Not on file  . Smokeless tobacco: Not on file  . Alcohol use Not on file    MEDS: Prior to Admission medications   Not on File    ALLERGY: Allergies not on file  ROS: ROS  NEUROLOGIC EXAM: No eye opening to pain Pupils 5mm, nonreactive Not breathing over vent No cough, no gag No motor responses to pain  IMGAING: CTH demonstrates large right frontoparietal SDH with severe mass effect and effacement of the basal cisterns and foramen magnum indicating transtentorial and uncal herniation.  LABS: Troponin 0.03 INR 1.26  IMPRESSION: - 65 y.o. female with large acute right SDH with clinical and radiographic evidence of severe brainstem compression. Given the acuity, her age, and good premorbid condition, I think craniotomy for evacuation is reasonable to give her a chance for recovery, although given her current condition I do think her prognosis is probably poor.  PLAN: - Will proceed with right craniotomy for evacuation of subdural hematoma on an emergent basis. - Will give a dose of 23.4% HTS  I have reviewed the situation with the patient's family. I told them that while I think her prognosis is likely not good, I think it  reasonable to proceed with decompression to give her a chance for recovery. They understand the situation and prognosis as well as the risks/benefits, and alternatives to surgery. All questions were answered.

## 2016-06-07 NOTE — Consult Note (Signed)
PULMONARY / CRITICAL CARE MEDICINE   Name: Beth Jackson MRN: [id number] DOB: 12/13/1950    ADMISSION DATE:  05/23/2016 CONSULTATION DATE:  05/27/2016  REFERRING MD :  Dr. Conchita ParisNundkumar  CHIEF COMPLAINT:  Subdural hemorrhage, ventilator management   HISTORY OF PRESENT ILLNESS:   65 yo F with h/o depression found down at home in the shower by family after they heard a "thud" in the bathroom.  BIBA and found to have large frontoparietal SDH with severe mass effect and effecement of the basal cisterns and foramen magnum, with transtentoral and uncal herniation.  She underwent Right frontotemporoparietal craniectomy for evacuation of subdural hematoma.  PAST MEDICAL HISTORY :   has a past medical history of Depression.  has no past surgical history on file. Prior to Admission medications   Not on File   Allergies not on file  FAMILY HISTORY:  has no family status information on file.   SOCIAL HISTORY:    REVIEW OF SYSTEMS:   Unable to obtain  SUBJECTIVE:   VITAL SIGNS: Temp:  [89.9 F (32.2 C)-97 F (36.1 C)] 89.9 F (32.2 C) (12/31 1525) Pulse Rate:  [54-134] 120 (12/31 1930) Resp:  [14-35] 16 (12/31 1930) BP: (43-191)/(30-108) 105/66 (12/31 1930) SpO2:  [98 %-100 %] 100 % (12/31 1939) Arterial Line BP: (62-195)/(33-109) 97/54 (12/31 1930) FiO2 (%):  [50 %-100 %] 60 % (12/31 1939) Weight:  [68 kg (150 lb)] 68 kg (150 lb) (12/31 1102) HEMODYNAMICS:   VENTILATOR SETTINGS: Vent Mode: AC FiO2 (%):  [50 %-100 %] 60 % Set Rate:  [16 bmp] 16 bmp Vt Set:  [420 mL-500 mL] 420 mL PEEP:  [5 cmH20] 5 cmH20 Plateau Pressure:  [15 cmH20] 15 cmH20 INTAKE / OUTPUT:  Intake/Output Summary (Last 24 hours) at 05/26/2016 2017 Last data filed at 05/13/2016 1900  Gross per 24 hour  Intake          5599.95 ml  Output             2150 ml  Net          3449.95 ml    PHYSICAL EXAMINATION: General:  Non-responsive, GCS - 3 Neuro:  Pulpils fixed and dilated, no cough, no gag, no  response to pain. HEENT:  ETT in place Cardiovascular:  RRR, s1/S2 no m/r/g Lungs:  CTA b/l, no w/r/r Abdomen:  Soft, no bowel sounds Musculoskeletal:  Normal bulk and tone Skin:  No c/c/e  LABS:  CBC  Recent Labs Lab 05/22/2016 1058  06/02/2016 1357 05/24/2016 1412 05/11/2016 1430  WBC 9.4  --   --   --   --   HGB 13.3  < > 12.9 13.3 6.5*  HCT 42.1  < > 38.0 39.0 19.0*  PLT 208  --   --   --   --   < > = values in this interval not displayed. Coag's  Recent Labs Lab 05/17/2016 1058  APTT 41*  INR 1.26   BMET  Recent Labs Lab 05/10/2016 1058 05/18/2016 1107  05/23/2016 1357 05/18/2016 1412 05/16/2016 1430 05/21/2016 1819  NA 137 140  < > 143 143 150* 147*  K 3.5 3.5  --  3.7 3.8 3.8  --   CL 105 104  --   --   --   --   --   CO2 19*  --   --   --   --   --   --   BUN 16 18  --   --   --   --   --  CREATININE 1.26* 1.00  --   --   --   --   --   GLUCOSE 263* 263*  --   --  206*  --   --   < > = values in this interval not displayed. Electrolytes  Recent Labs Lab 12/10/2015 1058  CALCIUM 8.6*   Sepsis Markers No results for input(s): LATICACIDVEN, PROCALCITON, O2SATVEN in the last 168 hours. ABG  Recent Labs Lab 12/10/2015 1158 12/10/2015 1357 12/10/2015 1430  PHART 7.267* 7.322* 7.335*  PCO2ART 45.2 41.7 35.7  PO2ART 257.0* 442.0* 374.0*   Liver Enzymes  Recent Labs Lab 12/10/2015 1058  AST 39  ALT 21  ALKPHOS 48  BILITOT 1.0  ALBUMIN 3.2*   Cardiac Enzymes  Recent Labs Lab 12/10/2015 1058  TROPONINI 0.03*   Glucose  Recent Labs Lab 12/10/2015 1656  GLUCAP 212*    Imaging No results found.   ASSESSMENT / PLAN: 65 yo CF with SDH with trans tentorial herniation and subsequent catastrophic neurologic exam.  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   GCS 3  P:   RASS goal: N/a - Plan per neurosurgery - likely evaluation for clinical death by neurologic criteria in AM (she had rocuronium at 1300 today)  Total critical care time: 30 min  Critical care time was exclusive of separately billable procedures and treating other patients.  Critical care was necessary to treat or prevent imminent or life-threatening deterioration.  Critical care was time spent personally by me on the following activities: development of treatment plan with patient and/or surrogate as well as nursing, discussions with consultants, evaluation of patient's response to treatment, examination of patient, obtaining history from patient or surrogate, ordering and performing treatments and interventions, ordering and review of laboratory studies, ordering and review of radiographic studies, pulse oximetry and re-evaluation of patient's condition.   Galvin Profferaniel Elisah Parmer, DO., MS [facility]    Pulmonary and Critical Care Medicine [facility] Pager: [phone]  01-Jun-2016, 8:17 PM

## 2016-06-07 NOTE — Consult Note (Signed)
Cardiology Consultation Note    Patient ID: Beth Jackson, MRN: [id number], DOB/AGE: Jun 10, 1950 65 y.o. Admit date: 05/21/2016   Date of Consult: 06/05/2016 [facility] Primary Cardiologist: None  Chief Complaint: Syncope Reason for Consultation: STEMI Requesting MD: Cathren LaineKevin Steinl, MD  HPI: Beth Jackson is a 65 y.o. female with history of depression and remote tobacco use (per husband), who [facility] after passing out in the shower this morning. She is currently intubated and unresponsive. History is obtained from the patient's husband. She was reportedly in her usual state of health. Per her husband, the patient had not complained of any chest pain or shortness of breath leading up to the event. She has, however, been very guarded about her health history in the past. She has been quite depressed over the last several months following her mother's death. Her husband heard a loud thud from the bathroom and found the patient unresponsive, curled in the fetal position in the shower. She was reportedly breathing at the time. He did not check for pulse. He contacted 911, with EMS arriving approximately 10 minutes later. The patient was reportedly apneic and pulseless in a tachycarrhythmia with a heart rate greater than 200 bpm. Defibrillation was performed with conversion to atrial fibrillation and return of pulse. It does not appear that CPR or ACLS medications were ever administered. In the ED, anterior ST segment elevation with reciprocal depressions in the inferior and lateral leads was noted, prompting STEMI alert.  I found the patient to be unresponsive and hypotensive with a faint carotid pulse. Normal saline bolus and norepinephrine infusion were administered with improvement in blood pressure. Due to history of fall and small scalp contusion, the patient was taken for stat head CT prior to cardiac catheterization. Head CT  revealed large right subdural hematoma with significant mass effect and midline shift. Cardiac catheterization was therefore canceled and neurosurgical evaluation requested by Dr. Denton LankSteinl.  Past Medical History:  Diagnosis Date  . Depression       Surgical History: History reviewed. No pertinent surgical history.   Home Meds: Per husband, aspirin 81 mg daily and some sort of antidepressant.  Inpatient Medications:   . norepinephrine (LEVOPHED) Adult infusion 12 mcg/min (06/03/2016 1152)    Allergies: Allergies not on file  Social history: The patient is married. She is a former smoker, having stopped a few years ago.  Family history: Per the patient's husband, her parents had heart disease, both of whom died recently.  Review of Systems: Unable to be performed due to the patient's unresponsiveness and intubation.  Labs: No results for input(s): CKTOTAL, CKMB, TROPONINI in the last 72 hours. Lab Results  Component Value Date   WBC 9.4 05/24/2016   HGB 13.6 06/01/2016   HCT 40.0 05/24/2016   MCV 94.6 05/27/2016   PLT 208 05/26/2016     Recent Labs Lab 06/06/2016 1107  NA 140  K 3.5  CL 104  BUN 18  CREATININE 1.00  GLUCOSE 263*   Lab Results  Component Value Date   CHOL 204 (H) 05/31/2016   HDL 50 05/16/2016   LDLCALC 129 (H) 05/30/2016   TRIG 124 05/28/2016   No results found for: DDIMER  Radiology/Studies:  CT head (preliminary results): Large acute right subdural hematoma with approximately 1 cm of right to left midline shift.  Wt Readings from Last 3 Encounters:  05/22/2016 68 kg (150 lb)    EKG: Atrial  fibrillation with rapid ventricular response, anteroseptal ST segment elevation with otherwise diffuse ST depressions.  Physical Exam: Blood pressure 102/66, pulse 117, temperature 97 F (36.1 C), temperature source Rectal, resp. rate 16, height 5\' 3"  (1.6 m), weight 68 kg (150 lb), SpO2 100 %. Body mass index is 26.57 kg/m. General: Well-developed  woman, intubated and unresponsive despite not receiving sedation. Head:  Posterior scalp contusion noted. Pupils are fixed and dilated. Endotracheal tube is in place. Neck: Rigid C-spine collar in place. Unable to assess JVD. Lungs: Clear anteriorly. Heart: Distant heart sounds. Irregularly irregular without murmurs. Abdomen: Soft and on-distended with hypoactive bowel sounds. No hepatomegaly. No obvious abdominal masses. Msk:  Unable to assess, as patient is unresponsive. Extremities: No clubbing or cyanosis. No edema.  Left carotid pulse is 2+.  Femoral pulses are trace bilaterally.  Radial and pedal pulses are not palpable. Neuro: Unresponsive despite not receiving sedation.  No posturing. Psych:  Unable to assess.     Assessment and Plan  65 year old woman with history of depression, prior tobacco use, and family history of cardiovascular disease, who presents to the ER after syncope in the shower. She was reportedly pulseless in a tachyarrhythmia when EMS arrived, with successful conversion to atrial fibrillation after cardioversion 1. Post resuscitation EKG showed anteroseptal ST elevations with otherwise diffuse ST depressions concerning for ST segment elevation myocardial infarction. However, head CT revealed large subdural hematoma with mass effect. She remains unresponsive and hypotensive, likely due to cardiogenic shock, requiring norepinephrine to maintain blood pressure.  STEMI: EKG is concerning for acute myocardial infarction, which may have led to patient's syncope and head trauma precipitating subdural hematoma. In the setting of her acute intracranial hemorrhage, cardiac catheterization and all antithrombotic/antiplatelet therapy is contraindicated. We will therefore need to manage her MI conservatively. Of note, aspirin had been administered in the ER prior to head CT.  Avoid administration of any anticoagulants and antiplatelet agents until cleared to do so from neurosurgical  standpoint.  Initiate atorvastatin 80 mg daily.  Obtain transthoracic echocardiogram.  Obtain serial troponins, as well as lipid panel and hemoglobin A1c for risk stratification.  Shock: This is likely multifactorial, including cardiogenic and potentially neurogenic components.  Due to intracerebral hemorrhage, the patient is not a good candidate for mechanical hemodynamic support, as anticoagulation would be needed for Impella and prolonged intra-aortic balloon pump support.  Continue vasopressor support. Assistance from critical care medicine would be beneficial for management of vasoactive medications and mechanical ventilation.  Arrhythmia: The patient reportedly had a tachyarrhythmia greater than 200 bpm upon EMS arrival that converted to atrial fibrillation after cardioversion 1. She is currently in atrial fibrillation with rapid ventricular response.  Given hypotension, rate control with amiodarone (as well as potential prevention of ventricular arrhythmia in the setting of acute MI) may be beneficial.  Unable to anticoagulate due to subdural hematoma.  We will check TSH and echocardiogram.   Subdural hematoma: Most likely due to fall in the setting of syncopal episode from primary cardiac etiology.  Defer further management/interventions to neurosurgery and CCM.  Disposition/Code status: Patient's prognosis is very guarded.  I discussed this at length with the patient's husband and son. Her husband states that Ms. Caralee Atesndrews would not want long-term life support measures if she has no reasonable expectation of recovery. She will remain full code pending neurosurgical evaluation.  Defer further management to neurosurgery and CCM.  Zenovia JordanSigned, Celest Reitz MD 05/26/2016, 12:10 PM Pager: [phone]

## 2016-06-07 NOTE — Progress Notes (Signed)
I have reviewed the operative course with the patient's family. I explained to them that based on intraoperative findings including the severe intracranial pressure, brain swelling, and hemodynamic instability, that her injury is not survivable. I told them that addition of further medications to augment blood pressure or heart rate, and certainly CPR would not be reasonable things to do as her injury is not survivable. They seemed to understand our discussion, and are agreeable to the capping of interventions at this point.

## 2016-06-07 NOTE — ED Notes (Signed)
OR ready, transferring pt now

## 2016-06-07 NOTE — Op Note (Signed)
PREOP DIAGNOSIS:  1. Right subdural hematoma 2. Intracranial hypertension  POSTOP DIAGNOSIS: Same  PROCEDURE: 1. Right frontotemporoparietal craniectomy for evacuation of subdural hematoma  SURGEON: Dr. Lisbeth RenshawNeelesh Jemya Depierro, MD  ASSISTANT: None  ANESTHESIA: General Endotracheal  EBL: 1L  SPECIMENS: None  DRAINS: None  COMPLICATIONS: Hemodynamic instability with hypotension  CONDITION: Critical to PACU  HISTORY: Beth SprangSusan Schirmer is a 65 y.o. female presenting to the emergency department brought in by EMS after suffering a fall while in the shower at home. She was unresponsive at home requiring intubation. Upon my examination about 2 hours after her fall, she had fixed, dilated pupils without significant brainstem reflexes. Given the acuity of her decline and her premorbid condition, the decision was made to proceed with evacuation for decompression in an attempt to give her a chance for recovery. The situation was discussed with the patient's family including the likely grave prognosis, risks, benefits, and alternatives of the operation. Informed consent was obtained from the family.  PROCEDURE IN DETAIL: After informed consent was obtained and witnessed, the patient was brought to the operating room. After induction of general anesthesia, the patient was positioned on the operative table in the supine position. All pressure points were meticulously padded. A reverse question mark skin incision was then marked out and prepped and draped in the usual sterile fashion.  After timeout was conducted, the skin incision was made sharply, and Bovie electrocautery was used to dissect the subcutaneous tissue and the galea was incised. Hemostasis was achieved on the skin edges with Raney clips. A single piece myocutaneous flap was then elevated and retracted anteriorly. Bur holes were then created and a craniotomy was fashioned and elevated. Hemostasis was then achieved on the dural surface with bipolar  electrocautery. The dura was then opened in stellate fashion.  Immediately encountered was an acute subdural hematoma which was evacuated with suction. Very quickly after dural opening, the underlying brain began to herniate through the craniotomy defect. There was also a significant amount of bleeding noted from the frontal and temporal regions which appeared to be coming from the brain parenchyma being strangulated across the bone edge. At this point, the high-speed drill was then used to extend the craniotomy posteriorly and superiorly to fashion a trauma frontotemporal parietal flap. Dura was further extended in attempt to decompress further. Unfortunately, the brain continued to herniate through the craniotomy defect to a height of at least 2 cm above the bone edge. Concomitantly, the anesthesia service noted progressive hypotension requiring escalating doses of vasopressors. At this point, bleeding from the brain surface became nearly unmanageable with electrocautery. It became apparent that there would be no way to close the wound with the brain herniation. Bovie electrocautery was therefore used to amputate the herniated frontal and temporal lobes in an attempt to allow closure. At this point, it was obvious that the amount of intracranial pressure and brain edema was not compatible with survival.  FloSeal was used on the brain surface to achieve reasonable hemostasis. The galea was then reapproximated with multiple interrupted 0 Vicryl stitches, and a multilayer 1 Prolene running stitch was used to close the skin. Sterile dressing was then applied. Patient was somewhat stabilized by the anesthesia service including placement on vasopressors and blood transfusions. She was then taken to the postanesthesia care unit in critical condition. At the end of the case all sponge, needle, instrument, and cottonoid counts were correct.

## 2016-06-07 NOTE — Anesthesia Preprocedure Evaluation (Addendum)
Anesthesia Evaluation  Patient identified by MRN, date of birth, ID band Patient unresponsive  Preop documentation limited or incomplete due to emergent nature of procedure.  Airway Mallampati: Intubated       Dental   Pulmonary    breath sounds clear to auscultation       Cardiovascular  Rhythm:Regular Rate:Normal     Neuro/Psych    GI/Hepatic   Endo/Other    Renal/GU      Musculoskeletal   Abdominal   Peds  Hematology   Anesthesia Other Findings   Reproductive/Obstetrics                             Anesthesia Physical Anesthesia Plan  ASA: III and emergent  Anesthesia Plan: General   Post-op Pain Management:    Induction: Intravenous  Airway Management Planned: Oral ETT  Additional Equipment:   Intra-op Plan:   Post-operative Plan: Post-operative intubation/ventilation  Informed Consent:   Plan Discussed with: CRNA, Anesthesiologist and Surgeon  Anesthesia Plan Comments:        Anesthesia Quick Evaluation

## 2016-06-07 NOTE — ED Notes (Signed)
Evangeline GulaBrooke Miller, RN taking pt to CT 1, and then straight to cath lab 1

## 2016-06-07 NOTE — Progress Notes (Signed)
First prayed with pt's husband, son and daughter-in-law in ED after doctor's initial discussion of pt's status w/ them. Husband had also called his pastor to come, so gave his name to reception, checked back to escort him to pt's family. Back and forth with family throughout afternoon as more arrived in ED consultation rm, then on to OR waiting rm (where eventually escorted pastor to join them). Ultimately found doctor when he was ready to talk with family post-surgery, escorted them to surgery consultation rm, faciltitated their discussion as/after he informed them pt would not make it, providing emotional/spiritual support and ministry of presence. Visited further with pt's husband and immediate family in [place] after pt's transfer there (leaving one a copy of New Testament/Psalms), and  facilitated others still arriving with hospital  logistics. Husband is clear that wife said she would not want to be hooked to tubes. When teary-eyed after receiving news from doctor that she wouldn't make it, he confided that she'd been depressed since the death of her mom some months ago. He and his brother then discussed other recent losses of family members, agreeing it had been a terrible year. Pt's son and wife (with pt's husband all day) also took it hard, but agreed pt would not want to be on tubes. Chaplain available for f/u.    05/27/2016 1600  Clinical Encounter Type  Visited With Family;Patient not available;Health care provider  Visit Type Initial;Follow-up;Psychological support;Spiritual support;Social support;Pre-op;Patient in surgery;Post-op;ED;Trauma  Referral From Nurse  Spiritual Encounters  Spiritual Needs Prayer;Emotional;Grief support  Stress Factors  Patient Stress Factors Health changes;Other (Comment) (brain trauma not survivable)  Family Stress Factors Family relationships;Health changes;Loss;Loss of control;Other (Comment) (know pt would not want to be hooked to tubes)   Ephraim Hamburgerynthia A Sahar Ryback,  [address]

## 2016-06-07 NOTE — Addendum Note (Signed)
Addendum  created 06/02/2016 1534 by Atilano InaScot A Lailani Tool, CRNA   Anesthesia Intra Meds edited

## 2016-06-07 NOTE — ED Notes (Signed)
Per EDP and cardiology, due to CT scan results, no cath. Pt returned to Trauma C

## 2016-06-07 NOTE — Addendum Note (Signed)
Addendum  created 01-16-2016 1811 by Dorris Singhharlene Albena Comes, MD   Anesthesia Attestations filed, Anesthesia Event edited, Anesthesia Review and Sign - Signed, Sign clinical note, Visit Navigator Flowsheet section accepted

## 2016-06-07 NOTE — Anesthesia Postprocedure Evaluation (Signed)
Anesthesia Post Note  Patient: Beth SprangSusan Parrow  Procedure(s) Performed: Procedure(s) (LRB): CRANIECTOMY HEMATOMA EVACUATION SUBDURAL (Right)  Patient location during evaluation: ICU Anesthesia Type: General Level of consciousness: patient remains intubated per anesthesia plan Vital Signs Assessment: post-procedure vital signs reviewed and stable Respiratory status: patient remains intubated per anesthesia plan and patient on ventilator - see flowsheet for VS Cardiovascular status: stable Anesthetic complications: no       Last Vitals:  Vitals:   03-21-2016 1250 03-21-2016 1255  BP: (!) 87/58 97/68  Pulse: 77 78  Resp: 16 16  Temp:      Last Pain:  Vitals:   03-21-2016 1102  TempSrc: Rectal                 Tani Virgo

## 2016-06-07 NOTE — Progress Notes (Addendum)
Chaplain greeted large gathering of family and spoke with patient's husband.  Husband appeared calm and appreciative of multiple sources of support. In particular, he noted with appreciation the supportive care by Marijean Heathhaplain Drew and a [facility] counselor.  He also stated that patient's mother had died in May, his own father (patient's father-in-law) died in October.  He said he had been in touch with his pastor, who has visited and is monitoring the situation.  Currently, patient's one son, [place] in [place], is en route to pick up patient's other son, whose plane from [place] had just landed. Offered ongoing care as needed and left card.  Please call as needed.   Theodoro Parmaalacios, Roe Wilner N, Chaplain  [phone]      11/15/2015 2200  Clinical Encounter Type  Visited With Family  Visit Type Follow-up;Patient actively dying  Referral From Chaplain  Consult/Referral To Chaplain  Spiritual Encounters  Spiritual Needs Emotional;Grief support  Stress Factors  Family Stress Factors Major life changes

## 2016-06-07 NOTE — ED Triage Notes (Addendum)
Patient had syncopal event in shower this am and spouse heard a thump on floor. Spouse found patient pulseless on floor and started CPR. EMS arrival found patient apneic and was bagged. HR 200 and cardioverted at 100J by EMS. Intubated PTA with 7.0 ETT, on arrival patient hypotensive and received 400 NS pta. CBG 309

## 2016-06-07 NOTE — ED Provider Notes (Addendum)
[facility]-EMERGENCY DEPT Provider Note   CSN: [id number] Arrival date & time: 03-May-2016  1057     History   Chief Complaint Chief Complaint  Patient presents with  . Code STEMI    HPI Beth SprangSusan Jackson is a 65 y.o. female.  Patient arrives via EMS, intubated, s/p cardiorespiratory arrest and suspected STEMI.  EMS indicates significant other heart a thud where patient was in shower.   Patient was unresponsive.  EMS was called. No bystander CPR.  Unknown down time.  On arrival EMS found pt pulseless, heart rate 200s, and shocked x 1 with conversion to afib, rate 130, and return of pulses. EMS intubated patient and transported to ED.   They indicate patient remained unresponsive, motionless in route.  Suspected STEMI on initial ecg.  Patient unresponsive - level 5 caveat.    The history is provided by the patient and the EMS personnel. The history is limited by the condition of the patient.    History reviewed. No pertinent past medical history.  There are no active problems to display for this patient.   History reviewed. No pertinent surgical history.  OB History    No data available       Home Medications    Prior to Admission medications   Not on File    Family History No family history on file.  Social History Social History  Substance Use Topics  . Smoking status: Not on file  . Smokeless tobacco: Not on file  . Alcohol use Not on file     Allergies   Patient has no allergy information on record.   Review of Systems Review of Systems  Unable to perform ROS: Patient unresponsive  level 5 caveat   Physical Exam Updated Vital Signs BP (!) 46/34   Pulse 107   Temp 97 F (36.1 C) (Rectal)   Resp 24   Ht 5\' 3"  (1.6 m)   Wt 68 kg   SpO2 100%   BMI 26.57 kg/m   Physical Exam  Constitutional: She appears well-developed and well-nourished. She appears distressed.  HENT:  Contusion to posterior scalp.   Eyes: Conjunctivae are normal. No scleral  icterus.  Pupils 4 mm, unresponsive.   Neck: No tracheal deviation present.  Ccollar.   Cardiovascular: Normal rate and normal heart sounds.   Irregular rhythm.  Tachycardic. Femoral pulses palp bil, no distal pulses palp.   Pulmonary/Chest:  Bag ventilated. Bilateral breath sounds.   Abdominal: Soft. Normal appearance. She exhibits no distension. There is no tenderness.  Musculoskeletal: She exhibits no edema or deformity.  Neurological:  Unresponsive. GCS 3. No response to stimuli  Skin: Skin is warm and dry. No rash noted.  Psychiatric:  Unresponsive.   Nursing note and vitals reviewed.    ED Treatments / Results  Labs (all labs ordered are listed, but only abnormal results are displayed) Results for orders placed or performed during the hospital encounter of 03-May-2016  I-stat chem 8, ed  Result Value Ref Range   Sodium 140 135 - 145 mmol/L   Potassium 3.5 3.5 - 5.1 mmol/L   Chloride 104 101 - 111 mmol/L   BUN 18 6 - 20 mg/dL   Creatinine, Ser 9.381.00 0.44 - 1.00 mg/dL   Glucose, Bld 182263 (H) 65 - 99 mg/dL   Calcium, Ion 9.931.09 (L) 1.15 - 1.40 mmol/L   TCO2 18 0 - 100 mmol/L   Hemoglobin 13.6 12.0 - 15.0 g/dL   HCT 71.640.0 96.736.0 - 89.346.0 %  I-stat troponin, ED  Result Value Ref Range   Troponin i, poc 0.03 0.00 - 0.08 ng/mL   Comment 3           EKG  EKG Interpretation  Date/Time:  Sunday June 07 2016 10:54:05 EST Ventricular Rate:  139 PR Interval:    QRS Duration: 89 QT Interval:  347 QTC Calculation: 528 R Axis:   59 Text Interpretation:  Atrial fibrillation with rapid ventricular response `marked st elevation v1-v2 with st dep inf and lat, c/w stemi Prolonged QT interval No previous tracing Confirmed by Denton LankSTEINL  MD, Caryn BeeKEVIN ([id number]) on 05/10/2016 11:33:14 AM       Radiology No results found.  Procedures Procedures (including critical care time)  Medications Ordered in ED Medications  norepinephrine (LEVOPHED) 4 mg in dextrose 5 % 250 mL (0.016 mg/mL) infusion  (not administered)  sodium chloride 0.9 % bolus 1,000 mL (not administered)  DOPamine (INTROPIN) 400 mg in dextrose 5 % 250 mL infusion (not administered)  aspirin suppository 300 mg (300 mg Rectal Given 05/22/2016 1113)  sodium chloride 0.9 % bolus 1,000 mL (1,000 mLs Intravenous New Bag/Given 05/20/2016 1113)  sodium chloride 0.9 % bolus 1,000 mL (1,000 mLs Intravenous New Bag/Given 05/11/2016 1121)     Initial Impression / Assessment and Plan / ED Course  I have reviewed the triage vital signs and the nursing notes.  Pertinent labs & imaging results that were available during my care of the patient were reviewed by me and considered in my medical decision making (see chart for details).  Clinical Course     Iv ns x 2.  Respiratory therapy consulted, placed on ventilator.   Initial ecg concerning for stemi - pt had been made code stemi prior to arrival.  Rectal asa ordered.   Cardiology in ED.  They agree with stat head ct then to cath lab.   Pcxr. Stat ct. Labs.  bp low.  Iv ns boluses.   Levophed.  Recheck remains unresponsive. bil fem pulses strong.  Ct resulted - rad called at 1145, NS paged at 1145. Rectal asa d/c'd/ removed.   NS call back pending.  Cardiology updated.   bp improved.    CRITICAL CARE  RE cardiorespiratory arrest, ?STEMI, contusion head, intubated/mechanically ventilated, hypotension, large SDH hematoma with midline shift.  Performed by: Suzi RootsSTEINL,Briyana Badman E Total critical care time: 35 minutes Critical care time was exclusive of separately billable procedures and treating other patients. Critical care was necessary to treat or prevent imminent or life-threatening deterioration. Critical care was time spent personally by me on the following activities: development of treatment plan with patient and/or surrogate as well as nursing, discussions with consultants, evaluation of patient's response to treatment, examination of patient, obtaining history from patient  or surrogate, ordering and performing treatments and interventions, ordering and review of laboratory studies, ordering and review of radiographic studies, pulse oximetry and re-evaluation of patient's condition.   Final Clinical Impressions(s) / ED Diagnoses   Final diagnoses:  None    New Prescriptions New Prescriptions   No medications on file         Cathren LaineKevin Dian Laprade, MD 06/02/2016 1152

## 2016-06-07 NOTE — Progress Notes (Signed)
eLink Physician-Brief Progress Note Patient Name: Beth SprangSusan Butkiewicz DOB: 1951/03/26 MRN: [id number]   Date of Service  06/04/2016  HPI/Events of Note  Request for fluid bolus and type and screen from [facility].  eICU Interventions  Will order: 1. Type and screen. 2. Bolus with 0.9 NaCl 1 liter IV over 1 hour now.      Intervention Category Intermediate Interventions: Hypovolemia - evaluation and management Minor Interventions: Clinical assessment - ordering diagnostic tests  Lenell AntuSommer,Chelise Hanger [place] 05/14/2016, 8:29 PM

## 2016-06-07 NOTE — Progress Notes (Signed)
Visited w/ various family members at different times in hall outside [place] and in waiting rm, offering spiritual/emotional support. Visited w/ one in rm as husband was talking w/ [facility] re: possibility of organ donation. She said that the last son is on a flight here from [place] and will arrive after 9 pm. Then the family will be ready to let pt go. Encouraged her to call the night chaplain, available for f/u, if/when desired.    05/24/2016 2000  Clinical Encounter Type  Visited With Family  Visit Type Follow-up;Psychological support;Spiritual support;Critical Care;Other (Comment) (family accepts they will let pt go)  Referral From Chaplain  Spiritual Encounters  Spiritual Needs Emotional;Grief support  Stress Factors  Family Stress Factors Family relationships;Loss   Ephraim Hamburgerynthia A Norva Bowe, Chaplain

## 2016-06-08 ENCOUNTER — Other Ambulatory Visit (HOSPITAL_COMMUNITY)
Admission: AD | Admit: 2016-06-08 | Discharge: 2016-06-08 | Disposition: A | Discharge status: Home / Self Care | Payer: PPO | Source: Ambulatory Visit | Attending: Neurosurgery | Admitting: Neurosurgery

## 2016-06-08 ENCOUNTER — Encounter (HOSPITAL_COMMUNITY): Admission: EM | Disposition: E | Payer: Self-pay | Source: Home / Self Care | Attending: Neurosurgery

## 2016-06-08 ENCOUNTER — Encounter (HOSPITAL_COMMUNITY): Payer: Self-pay | Admitting: Neurosurgery

## 2016-06-08 DIAGNOSIS — 419620001 Death: Secondary | SNOMED CT

## 2016-06-08 DIAGNOSIS — S0003XA Contusion of scalp, initial encounter: Secondary | ICD-10-CM | POA: Insufficient documentation

## 2016-06-08 HISTORY — PX: ORGAN PROCUREMENT: SHX5270

## 2016-06-08 LAB — URINALYSIS, ROUTINE W REFLEX MICROSCOPIC
Bilirubin Urine: NEGATIVE
Glucose, UA: >=500 mg/dL — AB
Ketones, ur: NEGATIVE mg/dL
Leukocytes, UA: NEGATIVE
Nitrite: NEGATIVE
Protein, ur: NEGATIVE mg/dL
SPECIFIC GRAVITY, URINE: 1.01 (ref 1.005–1.030)
pH: 5 (ref 5.0–8.0)

## 2016-06-08 LAB — URINALYSIS, MICROSCOPIC (REFLEX)

## 2016-06-08 LAB — COMPREHENSIVE METABOLIC PANEL
ALBUMIN: 3.1 g/dL — AB (ref 3.5–5.0)
ALT: 17 U/L (ref 14–54)
ANION GAP: 9 (ref 5–15)
AST: 51 U/L — AB (ref 15–41)
Alkaline Phosphatase: 19 U/L — ABNORMAL LOW (ref 38–126)
BUN: 11 mg/dL (ref 6–20)
CHLORIDE: 130 mmol/L — AB (ref 101–111)
CO2: 17 mmol/L — ABNORMAL LOW (ref 22–32)
Calcium: 7 mg/dL — ABNORMAL LOW (ref 8.9–10.3)
Creatinine, Ser: 1.06 mg/dL — ABNORMAL HIGH (ref 0.44–1.00)
GFR calc Af Amer: >60 mL/min (ref >60)
GFR, EST NON AFRICAN AMERICAN: 54 mL/min — AB (ref >60)
Glucose, Bld: 223 mg/dL — ABNORMAL HIGH (ref 65–99)
POTASSIUM: 4.6 mmol/L (ref 3.5–5.1)
Sodium: 156 mmol/L — ABNORMAL HIGH (ref 135–145)
Total Bilirubin: 0.8 mg/dL (ref 0.3–1.2)
Total Protein: 4.4 g/dL — ABNORMAL LOW (ref 6.5–8.1)

## 2016-06-08 LAB — BLOOD PRODUCT ORDER (VERBAL) VERIFICATION

## 2016-06-08 LAB — SODIUM: SODIUM: 154 mmol/L — AB (ref 135–145)

## 2016-06-08 SURGERY — SURGICAL PROCUREMENT, ORGAN
Anesthesia: Monitor Anesthesia Care | Site: Abdomen

## 2016-06-08 MED ORDER — HEPARIN SODIUM (PORCINE) 1000 UNIT/ML IJ SOLN
20400.0000 [IU] | Freq: Once | INTRAMUSCULAR | Status: DC
  Filled 2016-06-08: qty 20.4

## 2016-06-08 MED ORDER — MORPHINE SULFATE (PF) 4 MG/ML IV SOLN
INTRAVENOUS | Status: AC
  Filled 2016-06-08: qty 1

## 2016-06-08 SURGICAL SUPPLY — 64 items
APPLICATOR CHLORAPREP 3ML ORNG (MISCELLANEOUS) ×3 IMPLANT
BLADE 10 SAFETY STRL DISP (BLADE) ×6 IMPLANT
BLADE STERNUM SYSTEM 6 (BLADE) ×3 IMPLANT
BLADE SURG 10 STRL SS (BLADE) ×9 IMPLANT
BLADE SURG 11 STRL SS (BLADE) ×3 IMPLANT
BLADE SURG ROTATE 9660 (MISCELLANEOUS) IMPLANT
CANNULA VESSEL W/WING WO/VALVE (CANNULA) IMPLANT
CLIP TI MEDIUM 24 (CLIP) ×6 IMPLANT
CLIP TI WIDE RED SMALL 24 (CLIP) ×6 IMPLANT
CONT SPEC 4OZ CLIKSEAL STRL BL (MISCELLANEOUS) ×18 IMPLANT
CONT SPEC STER OR (MISCELLANEOUS) ×12 IMPLANT
COVER MAYO STAND STRL (DRAPES) ×3 IMPLANT
COVER SURGICAL LIGHT HANDLE (MISCELLANEOUS) ×9 IMPLANT
COVER TABLE BACK 60X90 (DRAPES) IMPLANT
DRAPE PROXIMA HALF (DRAPES) IMPLANT
DRAPE SLUSH MACHINE 52X66 (DRAPES) ×6 IMPLANT
DRESSING TELFA 8X3 (GAUZE/BANDAGES/DRESSINGS) IMPLANT
DRSG COVADERM 4X10 (GAUZE/BANDAGES/DRESSINGS) ×6 IMPLANT
DURAPREP 26ML APPLICATOR (WOUND CARE) IMPLANT
ELECT BLADE 6.5 EXT (BLADE) IMPLANT
ELECT REM PT RETURN 9FT ADLT (ELECTROSURGICAL) ×6
ELECTRODE REM PT RTRN 9FT ADLT (ELECTROSURGICAL) ×2 IMPLANT
GAUZE SPONGE 4X4 16PLY XRAY LF (GAUZE/BANDAGES/DRESSINGS) IMPLANT
GOWN STRL REUS W/ TWL LRG LVL3 (GOWN DISPOSABLE) ×4 IMPLANT
GOWN STRL REUS W/TWL LRG LVL3 (GOWN DISPOSABLE) ×8
KIT POST MORTEM ADULT 36X90 (BAG) ×3 IMPLANT
KIT ROOM TURNOVER OR (KITS) ×3 IMPLANT
LOOP VESSEL MAXI BLUE (MISCELLANEOUS) ×3 IMPLANT
LOOP VESSEL MINI RED (MISCELLANEOUS) IMPLANT
MANIFOLD NEPTUNE II (INSTRUMENTS) IMPLANT
NEEDLE BIOPSY 14X6 SOFT TISS (NEEDLE) ×3 IMPLANT
NS IRRIG 1000ML POUR BTL (IV SOLUTION) ×21 IMPLANT
PACK AORTA (CUSTOM PROCEDURE TRAY) ×3 IMPLANT
PAD ARMBOARD 7.5X6 YLW CONV (MISCELLANEOUS) ×6 IMPLANT
PENCIL BUTTON HOLSTER BLD 10FT (ELECTRODE) ×3 IMPLANT
SOLUTION BETADINE 4OZ (MISCELLANEOUS) IMPLANT
SPONGE INTESTINAL PEANUT (DISPOSABLE) IMPLANT
SPONGE LAP 18X18 X RAY DECT (DISPOSABLE) IMPLANT
STAPLER VISISTAT 35W (STAPLE) IMPLANT
SUCTION POOLE TIP (SUCTIONS) ×6 IMPLANT
SUT BONE WAX W31G (SUTURE) ×3 IMPLANT
SUT ETHIBOND 5 LR DA (SUTURE) ×12 IMPLANT
SUT ETHILON 1 LR 30 (SUTURE) ×6 IMPLANT
SUT ETHILON 2 LR (SUTURE) IMPLANT
SUT PROLENE 6 0 BV (SUTURE) ×12 IMPLANT
SUT SILK 0 TIES 10X30 (SUTURE) ×3 IMPLANT
SUT SILK 1 SH (SUTURE) ×12 IMPLANT
SUT SILK 1 TIES 10X30 (SUTURE) IMPLANT
SUT SILK 2 0 (SUTURE) ×2
SUT SILK 2 0 SH (SUTURE) IMPLANT
SUT SILK 2 0 SH CR/8 (SUTURE) IMPLANT
SUT SILK 2 0 TIES 10X30 (SUTURE) ×3 IMPLANT
SUT SILK 2-0 18XBRD TIE 12 (SUTURE) ×1 IMPLANT
SUT SILK 3 0 TIES 10X30 (SUTURE) ×3 IMPLANT
SWAB COLLECTION DEVICE MRSA (MISCELLANEOUS) ×3 IMPLANT
SYRINGE TOOMEY DISP (SYRINGE) IMPLANT
TAPE UMBILICAL 1/8 X36 TWILL (MISCELLANEOUS) IMPLANT
TOWEL OR 17X24 6PK STRL BLUE (TOWEL DISPOSABLE) ×3 IMPLANT
TOWEL OR 17X26 10 PK STRL BLUE (TOWEL DISPOSABLE) ×6 IMPLANT
TUBE ANAEROBIC SPECIMEN COL (MISCELLANEOUS) ×3 IMPLANT
TUBE CONNECTING 12'X1/4 (SUCTIONS) ×1
TUBE CONNECTING 12X1/4 (SUCTIONS) ×2 IMPLANT
WATER STERILE IRR 1000ML POUR (IV SOLUTION) IMPLANT
YANKAUER SUCT BULB TIP NO VENT (SUCTIONS) ×3 IMPLANT

## 2016-06-08 DEATH — deceased

## 2016-06-09 ENCOUNTER — Encounter (HOSPITAL_COMMUNITY): Payer: Self-pay

## 2016-06-09 LAB — URINE CULTURE: CULTURE: NO GROWTH

## 2016-06-09 MED FILL — Medication: Qty: 1 | Status: AC

## 2016-06-10 LAB — CULTURE, RESPIRATORY W GRAM STAIN

## 2016-06-10 LAB — CULTURE, RESPIRATORY: CULTURE: NORMAL

## 2016-06-11 ENCOUNTER — Encounter: Payer: Self-pay | Admitting: Neurology

## 2016-06-11 LAB — TYPE AND SCREEN
ABO/RH(D): O POS
ANTIBODY SCREEN: NEGATIVE
UNIT DIVISION: 0
UNIT DIVISION: 0
UNIT DIVISION: 0
Unit division: 0
Unit division: 0
Unit division: 0

## 2016-06-13 LAB — CULTURE, BLOOD (ROUTINE X 2)
Culture: NO GROWTH
Culture: NO GROWTH

## 2016-07-09 NOTE — Discharge Summary (Signed)
  Physician Discharge Summary  Patient ID: Beth SprangSusan Jackson MRN: [id number] DOB/AGE: [birth year] 66 y.o.  Admit date: 02/11/2016 Discharge date: 06/30/2016  Admission Diagnoses:  Subdural hematoma  Discharge Diagnoses:  Same Active Problems:   S/P craniotomy   Subdural hematoma [facility](HCC)  Hospital Course:  Beth SprangSusan Dressel is a 66 y.o. female admitted through the ED after a fall in the shower. She presented with clinical signs of severe intracranial hypertension and CT demonstrated large subdural hematoma with midline shift. She was taken to the OR for evacuation. She became hemodynamically unstable in the OR and was taken to the ICU. She did not demonstrate any sign of neurologic recovery. Her family elected to donate organs and she was extubated in the OR and expired.  Treatments: Surgery - craniotomy for evacuation of subdural hematoma  Disposition: 20-Expired   Allergies as of 06/13/2016      Reactions   Boniva [ibandronic Acid]    Codeine    Zithromax [azithromycin]       Medication List    You have not been prescribed any medications.      SignedLisbeth Renshaw: Cherita Hebel, C 06/30/2016, 8:16 AM

## 2016-07-09 NOTE — Progress Notes (Signed)
Received call from [facility] for 300 units/kg of heparin to be given for OR. Entered verbal order.

## 2016-07-09 NOTE — Progress Notes (Signed)
Patient Name: Beth Jackson Date of Encounter: 07/08/16  Primary Cardiologist: [facility] Problem List     Active Problems:   S/P craniotomy   Subdural hematoma (HCC)     Subjective   Intubated and unresponsive.  Inpatient Medications    Scheduled Meds: . chlorhexidine gluconate (MEDLINE KIT)  15 mL Mouth Rinse BID  . heparin  20,400 Units Intravenous Once  . levETIRAcetam  500 mg Intravenous Q12H  . mouth rinse  15 mL Mouth Rinse 10 times per day  . pantoprazole (PROTONIX) IV  40 mg Intravenous QHS   Continuous Infusions: . epinephrine Stopped (07/08/16 0550)  . lactated ringers 250 mL/hr at 07-08-2016 0452  . norepinephrine (LEVOPHED) Adult infusion Stopped (July 08, 2016 0158)  . vasopressin (PITRESSIN) infusion - *FOR SHOCK* 0.01 Units/min (2016-07-08 0551)   PRN Meds: ondansetron **OR** ondansetron (ZOFRAN) IV, promethazine   Vital Signs    Vitals:   07-08-16 0630 07-08-16 0645 2016/07/08 0700 2016-07-08 0800  BP: 126/87 121/87 (!) 126/96 (!) 149/91  Pulse: (!) 119 (!) 118 (!) 118 (!) 118  Resp: _0 Temp:      TempSrc:      SpO2: 99% 99% 99% 99%  Weight:      Height:        Intake/Output Summary (Last 24 hours) at July 08, 2016 0833 Last data filed at 07/08/16 0800  Gross per 24 hour  Intake         10932.51 ml  Output             5220 ml  Net          5712.51 ml   Filed Weights   06/03/2016 1102  Weight: 150 lb (68 kg)    Physical Exam    GEN: Well nourished, well developed, intubated HEENT: surgical dressing and drain in place Neck: Supple, no JVD, carotid bruits, or masses. Cardiac: RRR, no murmurs, rubs, or gallops. No clubbing, cyanosis, edema.  Radials/DP/PT 2+ and equal bilaterally.  Respiratory:  Respirations regular and unlabored, clear to auscultation bilaterally. On vent Skin: warm and dry, no rash. Neuro:  No response  Labs    CBC  Recent Labs  05/26/2016 1058  06/04/2016 1412 06/05/2016 1430  WBC 9.4  --   --   --     NEUTROABS 4.4  --   --   --   HGB 13.3  < > 13.3 6.5*  HCT 42.1  < > 39.0 19.0*  MCV 94.6  --   --   --   PLT 208  --   --   --   < > = values in this interval not displayed. Basic Metabolic Panel  Recent Labs  05/26/2016 1058 05/18/2016 1107  05/27/2016 1412 05/23/2016 1430  07/08/2016 0055 07/08/16 0605  NA 137 140  < > 143 150*  < > 156* 154*  K 3.5 3.5  < > 3.8 3.8  --  4.6  --   CL 105 104  --   --   --   --  130*  --   CO2 19*  --   --   --   --   --  17*  --   GLUCOSE 263* 263*  --  206*  --   --  223*  --   BUN 16 18  --   --   --   --  11  --   CREATININE 1.26* 1.00  --   --   --   --  1.06*  --   CALCIUM 8.6*  --   --   --   --   --  7.0*  --   < > = values in this interval not displayed. Liver Function Tests  Recent Labs  05/13/2016 1058 June 28, 2016 0055  AST 39 51*  ALT 21 17  ALKPHOS 48 19*  BILITOT 1.0 0.8  PROT 5.8* 4.4*  ALBUMIN 3.2* 3.1*   No results for input(s): LIPASE, AMYLASE in the last 72 hours. Cardiac Enzymes  Recent Labs  05/20/2016 1058  TROPONINI 0.03*   BNP Invalid input(s): POCBNP D-Dimer No results for input(s): DDIMER in the last 72 hours. Hemoglobin A1C No results for input(s): HGBA1C in the last 72 hours. Fasting Lipid Panel  Recent Labs  05/19/2016 1058  CHOL 204*  HDL 50  LDLCALC 129*  TRIG 124  CHOLHDL 4.1   Thyroid Function Tests No results for input(s): TSH, T4TOTAL, T3FREE, THYROIDAB in the last 72 hours.  Invalid input(s): FREET3  Telemetry    Sinus tachycardia- Personally Reviewed  ECG    None done this am - Personally Reviewed  Radiology    Dg Chest Port 1 View  Result Date: 05/26/2016 CLINICAL DATA:  Intubation. EXAM: PORTABLE CHEST 1 VIEW COMPARISON:  None. FINDINGS: Endotracheal tube is 2.6 cm above the carina. Nasogastric tube extends into the stomach and beyond the inferior edge of the image. The lungs are grossly clear. No pneumothorax. No large effusion. Pulmonary vasculature is normal. IMPRESSION:  Support equipment appears satisfactorily positioned. The lungs are clear. Electronically Signed   By: Andreas Newport M.D.   On: 05/09/2016 23:21    Cardiac Studies   none  Patient Profile     66 yo F with h/o depression found down at home in the shower by family after they heard a "thud" in the bathroom.  BIBA and found to have large frontoparietal SDH with severe mass effect and effecement of the basal cisterns and foramen magnum, with transtentoral and uncal herniation.  She underwent Right frontotemporoparietalcraniectomy for evacuation of subdural hematoma.  Assessment & Plan    1. SDH with herniation. S/p craniectomy and evacuation of SDH. Poor prognossi 2. ? STEMI. Initial Ecg showed AFib with RVR and some ST elevation anteriorly. No follow up Ecg or troponins. Afib resolved. I suspect primary event was SDH and not STEMI. Not a candidate for any anticoagulation/antiplatelet therapy. Requiring pressors for BP support. Apparent plans for Neuro assessment this am for brain death. If so then no further cardiac evaluation is needed. Will be available for questions.   Signed, Marieclaire Bettenhausen [place], MD  06-28-16, 8:33 AM

## 2016-07-09 NOTE — Progress Notes (Signed)
RT extubated patient in OR. Patient lost pulse at 1042. No heart sounds auscultated at 1042 with second RN Ferman Hammingammy Blackburn. Patient remained pulseless and without heart sounds for 5 minutes. Patient pulseless and without heart sounds at 1047. Patient pronounced dead at 1047.

## 2016-07-09 DEATH — deceased

## 2018-03-30 IMAGING — CR DG CHEST 1V PORT
1 series · 1 of 1 positions shown · non-contrast
Comparison: None.

CLINICAL DATA: Intubation.

EXAM:
PORTABLE CHEST 1 VIEW

[AP]
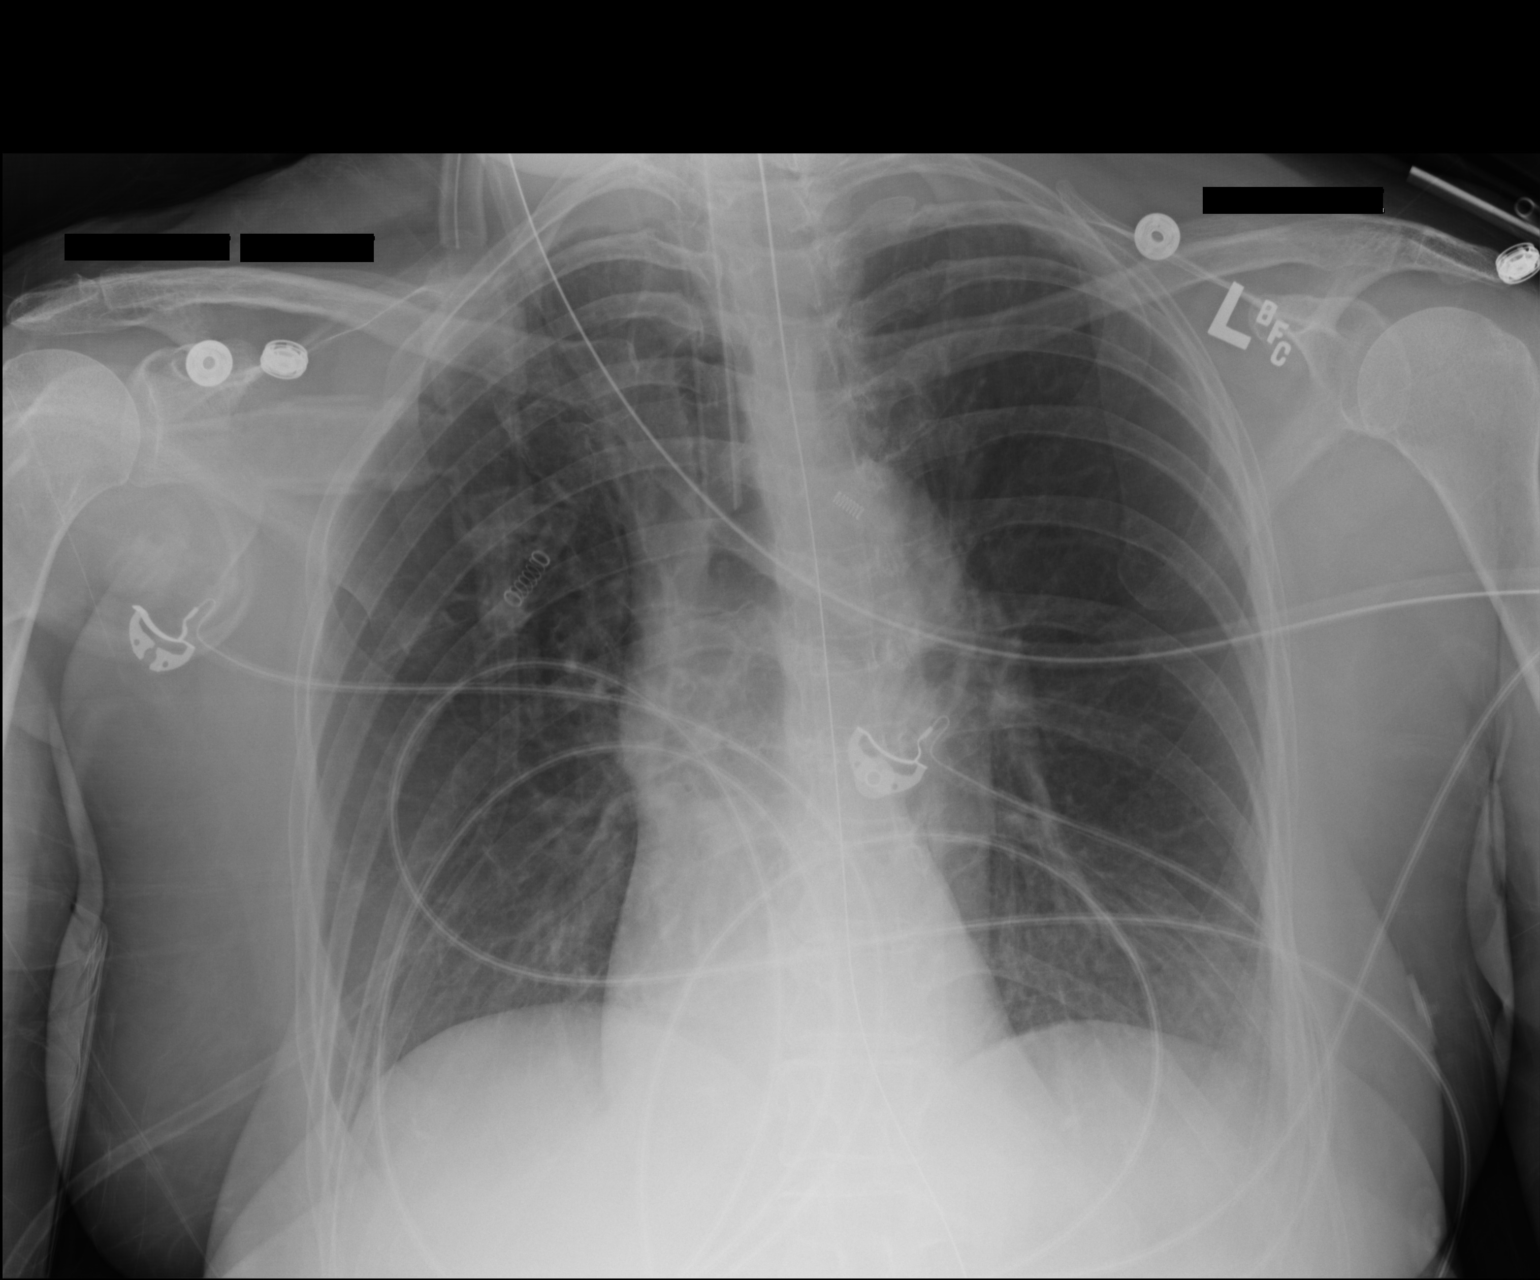

[1 of 1 positions shown; findings below may reference images not displayed]

FINDINGS: Endotracheal tube is 2.6 cm above the carina. Nasogastric tube
extends into the stomach and beyond the inferior edge of the image.
The lungs are grossly clear. No pneumothorax. No large effusion.
Pulmonary vasculature is normal.
IMPRESSION: Support equipment appears satisfactorily positioned. The lungs are
clear.

## 2018-03-30 IMAGING — CT CT CERVICAL SPINE W/O CM
1 of 10 series · 3 of 33 positions shown, 4 images · non-contrast
Comparison: None.

CLINICAL DATA: 65-year-old female status post unwitnessed fall in
shower. Intubated. Initial encounter.

EXAM:
CT HEAD WITHOUT CONTRAST
CT CERVICAL SPINE WITHOUT CONTRAST
TECHNIQUE: Multidetector CT imaging of the head and cervical spine was
performed following the standard protocol without intravenous
contrast. Multiplanar CT image reconstructions of the cervical spine
were also generated.

[Series 3010: orthognals · axial · 0.28mm/px · z∈[-140,-16]mm · 3 of 67 slices shown, 4 images]
[im 1/67  soft-tissue]
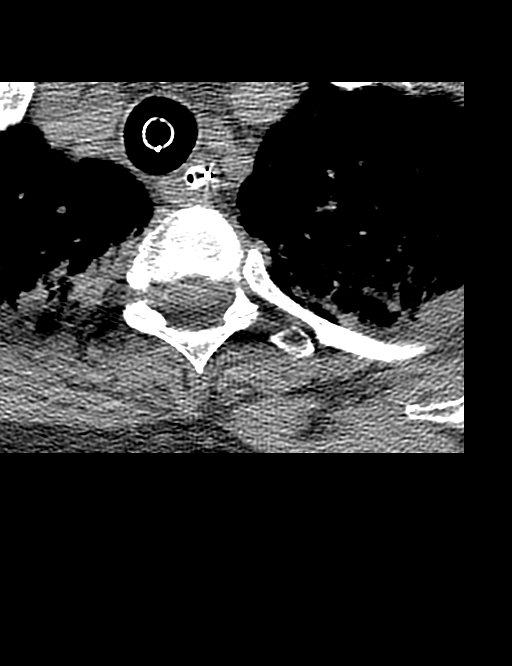
[im 1/67  bone]
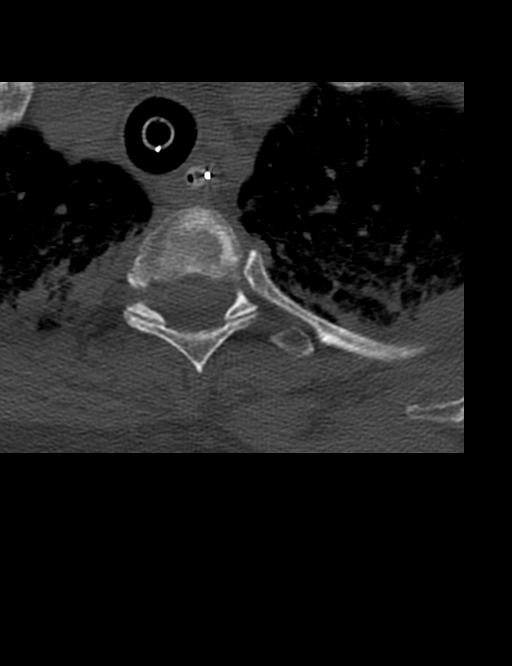
[im 34/67  bone]
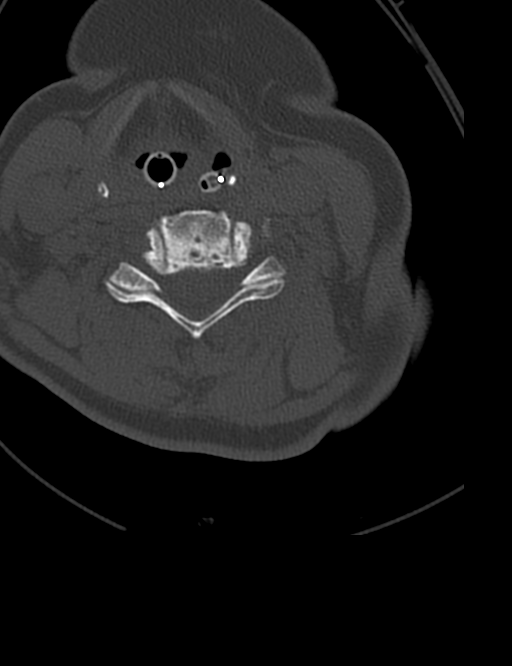
[im 67/67  bone]
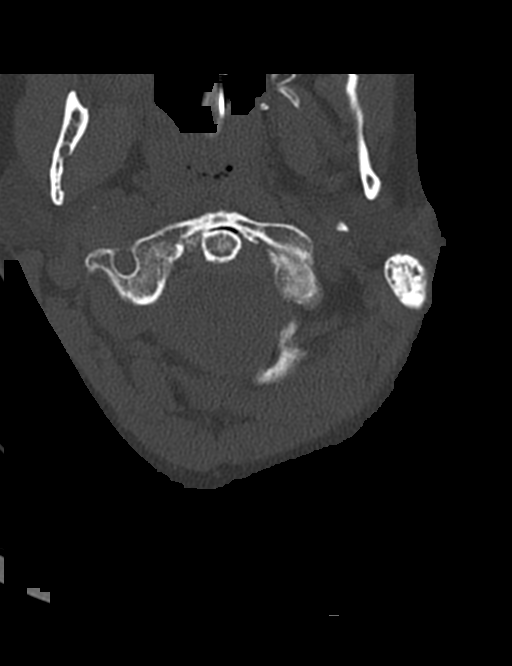

[3 of 33 positions shown; findings below may reference images not displayed]

FINDINGS: CT HEAD FINDINGS

Brain: Large mixed density but predominantly hyperdense right
subdural hematoma tracking along the hemisphere and measuring up to
1.5 cm in thickness. Significant intracranial mass effect with
effacement of most of the right lateral ventricle and leftward
midline shift of 14 mm.

The suprasellar and other basilar cisterns also are effaced. This
might be related to downward mass effect from the hematoma. There is
no convincing supratentorial edema. No definite brainstem or
cerebellar edema at this time.

Borderline to mild enlargement of the left lateral ventricle. The
third and fourth ventricles are effaced.

There is a superimposed vague 2 cm hyperdense mass along the
anterior middle cranial fossa abutting the posterior clinoid
process. See series 21, image 10 and coronal image 25. There is no
adjacent parenchymal edema identified.

Vascular: No suspicious intracranial vascular hyperdensity.

Skull: No skull or skullbase fracture identified.

Sinuses/Orbits: Clear. Small volume retained secretions in the
nasopharynx.

Other: No scalp hematoma identified. Visualized orbit soft tissues
are within normal limits.

CT CERVICAL SPINE FINDINGS

Alignment: Straightening of cervical lordosis. Mild anterolisthesis
of C4 on C5, favor degenerative in nature. Cervicothoracic junction
alignment is within normal limits. Bilateral posterior element
alignment is within normal limits.

Skull base and vertebrae: Visualized skull base is intact. No
atlanto-occipital dissociation. Degenerative changes at the anterior
C1-C2 articulation. C1 and C2 are intact. No acute cervical spine
fracture identified.

Soft tissues and spinal canal: Intubated and oral enteric tube in
place. The enteric tube courses into the proximal esophagus. The ET
tube extends into the trachea. Small volume fluid in the pharynx. No
prevertebral fluid or edema. Otherwise negative noncontrast neck
soft tissues.

Disc levels: Chronic disc degeneration in the lower cervical spine.
Up to mild degenerative spinal stenosis at C5-C6.

Upper chest: Visible upper thoracic levels appear intact.

There is abnormal confluent and mostly dependent, opacity in both
lung apices. There is superimposed pulmonary ground-glass opacity
and septal thickening.
IMPRESSION: 1. Large right superior and lateral convexity subdural hematoma
measuring up to 15 mm in thickness.
2. Significant intracranial mass effect including 14 mm of leftward
midline shift, but also downward mass effect on the brain such that
all basilar cisterns are effaced.
3. Subtle superimposed 2 cm oval hyperdense lesion along the
anterior right middle cranial fossa. Etiology is unclear but this
does not strongly resemble a hemorrhagic contusion and might be an
incidental meningioma.
4. No acute fracture or listhesis identified in the cervical spine.
Ligamentous injury is not excluded.
5. Intubated and OG tube in place with no adverse features
identified.
6. Abnormal biapical pulmonary opacity, in part suspicious for
aspiration in this clinical setting.
Preliminary critical / emergent results were called to Dr. ZERA
TSEHAY at 2228 hours today.

I also discussed the brain and cervical spine findings by telephone
with Neurosurgeon Dr. Duwan on 06/07/2016 at 2722 hours.
